# Patient Record
Sex: Male | Born: 1940
Health system: Southern US, Community
[De-identification: ages and names within clinical notes are randomized; demographics above are authoritative.]

## PROBLEM LIST (undated history)

## (undated) DIAGNOSIS — R972 Elevated prostate specific antigen [PSA]: Secondary | ICD-10-CM

## (undated) DIAGNOSIS — I10 Essential (primary) hypertension: Secondary | ICD-10-CM

## (undated) DIAGNOSIS — I2510 Atherosclerotic heart disease of native coronary artery without angina pectoris: Secondary | ICD-10-CM

## (undated) DIAGNOSIS — D649 Anemia, unspecified: Secondary | ICD-10-CM

## (undated) DIAGNOSIS — R0602 Shortness of breath: Secondary | ICD-10-CM

## (undated) DIAGNOSIS — R001 Bradycardia, unspecified: Secondary | ICD-10-CM

## (undated) DIAGNOSIS — F0390 Unspecified dementia without behavioral disturbance: Secondary | ICD-10-CM

## (undated) HISTORY — PX: CARDIAC SURGERY: SHX584

---

## 2008-09-22 ENCOUNTER — Ambulatory Visit: Payer: Self-pay | Admitting: Cardiology

## 2008-10-20 ENCOUNTER — Ambulatory Visit: Payer: Self-pay | Admitting: Unknown Physician Specialty

## 2013-07-24 ENCOUNTER — Ambulatory Visit: Payer: Self-pay | Admitting: Gastroenterology

## 2013-08-10 ENCOUNTER — Ambulatory Visit: Payer: Self-pay | Admitting: Gastroenterology

## 2013-08-13 LAB — PATHOLOGY REPORT

## 2013-12-31 DIAGNOSIS — E785 Hyperlipidemia, unspecified: Secondary | ICD-10-CM | POA: Insufficient documentation

## 2013-12-31 DIAGNOSIS — F0390 Unspecified dementia without behavioral disturbance: Secondary | ICD-10-CM | POA: Insufficient documentation

## 2013-12-31 DIAGNOSIS — I10 Essential (primary) hypertension: Secondary | ICD-10-CM | POA: Insufficient documentation

## 2013-12-31 DIAGNOSIS — I2510 Atherosclerotic heart disease of native coronary artery without angina pectoris: Secondary | ICD-10-CM | POA: Insufficient documentation

## 2014-04-13 DIAGNOSIS — I25810 Atherosclerosis of coronary artery bypass graft(s) without angina pectoris: Secondary | ICD-10-CM | POA: Insufficient documentation

## 2014-04-13 DIAGNOSIS — I495 Sick sinus syndrome: Secondary | ICD-10-CM | POA: Insufficient documentation

## 2014-04-13 DIAGNOSIS — D649 Anemia, unspecified: Secondary | ICD-10-CM | POA: Insufficient documentation

## 2014-04-13 DIAGNOSIS — M542 Cervicalgia: Secondary | ICD-10-CM | POA: Insufficient documentation

## 2014-04-13 DIAGNOSIS — I10 Essential (primary) hypertension: Secondary | ICD-10-CM | POA: Insufficient documentation

## 2014-12-28 DIAGNOSIS — F0150 Vascular dementia without behavioral disturbance: Secondary | ICD-10-CM | POA: Diagnosis not present

## 2014-12-28 DIAGNOSIS — I10 Essential (primary) hypertension: Secondary | ICD-10-CM | POA: Diagnosis not present

## 2014-12-28 DIAGNOSIS — E780 Pure hypercholesterolemia: Secondary | ICD-10-CM | POA: Diagnosis not present

## 2014-12-28 DIAGNOSIS — I2510 Atherosclerotic heart disease of native coronary artery without angina pectoris: Secondary | ICD-10-CM | POA: Diagnosis not present

## 2015-01-04 DIAGNOSIS — I10 Essential (primary) hypertension: Secondary | ICD-10-CM | POA: Diagnosis not present

## 2015-01-04 DIAGNOSIS — I2510 Atherosclerotic heart disease of native coronary artery without angina pectoris: Secondary | ICD-10-CM | POA: Diagnosis not present

## 2015-01-04 DIAGNOSIS — Z0000 Encounter for general adult medical examination without abnormal findings: Secondary | ICD-10-CM | POA: Diagnosis not present

## 2015-01-04 DIAGNOSIS — D649 Anemia, unspecified: Secondary | ICD-10-CM | POA: Diagnosis not present

## 2015-04-19 DIAGNOSIS — E780 Pure hypercholesterolemia: Secondary | ICD-10-CM | POA: Diagnosis not present

## 2015-04-19 DIAGNOSIS — I10 Essential (primary) hypertension: Secondary | ICD-10-CM | POA: Diagnosis not present

## 2015-04-19 DIAGNOSIS — I2510 Atherosclerotic heart disease of native coronary artery without angina pectoris: Secondary | ICD-10-CM | POA: Diagnosis not present

## 2015-04-19 DIAGNOSIS — I495 Sick sinus syndrome: Secondary | ICD-10-CM | POA: Diagnosis not present

## 2015-07-20 DIAGNOSIS — Z0000 Encounter for general adult medical examination without abnormal findings: Secondary | ICD-10-CM | POA: Diagnosis not present

## 2015-07-20 DIAGNOSIS — E7800 Pure hypercholesterolemia, unspecified: Secondary | ICD-10-CM | POA: Diagnosis not present

## 2015-07-20 DIAGNOSIS — I10 Essential (primary) hypertension: Secondary | ICD-10-CM | POA: Diagnosis not present

## 2015-07-20 DIAGNOSIS — D649 Anemia, unspecified: Secondary | ICD-10-CM | POA: Diagnosis not present

## 2015-07-20 DIAGNOSIS — Z125 Encounter for screening for malignant neoplasm of prostate: Secondary | ICD-10-CM | POA: Diagnosis not present

## 2015-07-20 DIAGNOSIS — I2510 Atherosclerotic heart disease of native coronary artery without angina pectoris: Secondary | ICD-10-CM | POA: Diagnosis not present

## 2015-07-27 DIAGNOSIS — D649 Anemia, unspecified: Secondary | ICD-10-CM | POA: Diagnosis not present

## 2015-07-27 DIAGNOSIS — I10 Essential (primary) hypertension: Secondary | ICD-10-CM | POA: Diagnosis not present

## 2015-07-27 DIAGNOSIS — M1712 Unilateral primary osteoarthritis, left knee: Secondary | ICD-10-CM | POA: Diagnosis not present

## 2015-07-27 DIAGNOSIS — F0150 Vascular dementia without behavioral disturbance: Secondary | ICD-10-CM | POA: Diagnosis not present

## 2016-01-23 DIAGNOSIS — D649 Anemia, unspecified: Secondary | ICD-10-CM | POA: Diagnosis not present

## 2016-01-23 DIAGNOSIS — F0150 Vascular dementia without behavioral disturbance: Secondary | ICD-10-CM | POA: Diagnosis not present

## 2016-01-23 DIAGNOSIS — I10 Essential (primary) hypertension: Secondary | ICD-10-CM | POA: Diagnosis not present

## 2016-01-30 DIAGNOSIS — M1712 Unilateral primary osteoarthritis, left knee: Secondary | ICD-10-CM | POA: Insufficient documentation

## 2016-01-30 DIAGNOSIS — I2510 Atherosclerotic heart disease of native coronary artery without angina pectoris: Secondary | ICD-10-CM | POA: Diagnosis not present

## 2016-01-30 DIAGNOSIS — I10 Essential (primary) hypertension: Secondary | ICD-10-CM | POA: Diagnosis not present

## 2016-01-30 DIAGNOSIS — E7800 Pure hypercholesterolemia, unspecified: Secondary | ICD-10-CM | POA: Diagnosis not present

## 2016-01-30 DIAGNOSIS — Z0000 Encounter for general adult medical examination without abnormal findings: Secondary | ICD-10-CM | POA: Diagnosis not present

## 2016-01-30 DIAGNOSIS — Z125 Encounter for screening for malignant neoplasm of prostate: Secondary | ICD-10-CM | POA: Diagnosis not present

## 2016-01-30 DIAGNOSIS — F0150 Vascular dementia without behavioral disturbance: Secondary | ICD-10-CM | POA: Diagnosis not present

## 2016-04-17 ENCOUNTER — Emergency Department
Admission: EM | Admit: 2016-04-17 | Discharge: 2016-04-17 | Payer: Commercial Managed Care - HMO | Attending: Emergency Medicine | Admitting: Emergency Medicine

## 2016-04-17 ENCOUNTER — Encounter: Payer: Self-pay | Admitting: Emergency Medicine

## 2016-04-17 DIAGNOSIS — Z7982 Long term (current) use of aspirin: Secondary | ICD-10-CM | POA: Insufficient documentation

## 2016-04-17 DIAGNOSIS — Z79899 Other long term (current) drug therapy: Secondary | ICD-10-CM | POA: Insufficient documentation

## 2016-04-17 DIAGNOSIS — K222 Esophageal obstruction: Secondary | ICD-10-CM | POA: Diagnosis not present

## 2016-04-17 DIAGNOSIS — R112 Nausea with vomiting, unspecified: Secondary | ICD-10-CM | POA: Diagnosis not present

## 2016-04-17 DIAGNOSIS — Z87891 Personal history of nicotine dependence: Secondary | ICD-10-CM | POA: Diagnosis not present

## 2016-04-17 LAB — CBC
HCT: 40.8 % (ref 40.0–52.0)
HEMOGLOBIN: 13.8 g/dL (ref 13.0–18.0)
MCH: 32.4 pg (ref 26.0–34.0)
MCHC: 33.9 g/dL (ref 32.0–36.0)
MCV: 95.6 fL (ref 80.0–100.0)
PLATELETS: 210 10*3/uL (ref 150–440)
RBC: 4.27 MIL/uL — AB (ref 4.40–5.90)
RDW: 13.4 % (ref 11.5–14.5)
WBC: 6.5 10*3/uL (ref 3.8–10.6)

## 2016-04-17 LAB — COMPREHENSIVE METABOLIC PANEL
ALK PHOS: 44 U/L (ref 38–126)
ALT: 17 U/L (ref 17–63)
ANION GAP: 8 (ref 5–15)
AST: 30 U/L (ref 15–41)
Albumin: 4.2 g/dL (ref 3.5–5.0)
BILIRUBIN TOTAL: 0.8 mg/dL (ref 0.3–1.2)
BUN: 9 mg/dL (ref 6–20)
CALCIUM: 9.6 mg/dL (ref 8.9–10.3)
CO2: 26 mmol/L (ref 22–32)
CREATININE: 1.07 mg/dL (ref 0.61–1.24)
Chloride: 101 mmol/L (ref 101–111)
Glucose, Bld: 103 mg/dL — ABNORMAL HIGH (ref 65–99)
Potassium: 4.2 mmol/L (ref 3.5–5.1)
Sodium: 135 mmol/L (ref 135–145)
TOTAL PROTEIN: 7.2 g/dL (ref 6.5–8.1)

## 2016-04-17 LAB — DIFFERENTIAL
BASOS ABS: 0.1 10*3/uL (ref 0–0.1)
Basophils Relative: 1 %
EOS ABS: 0.4 10*3/uL (ref 0–0.7)
EOS PCT: 6 %
LYMPHS ABS: 1.6 10*3/uL (ref 1.0–3.6)
LYMPHS PCT: 25 %
Monocytes Absolute: 0.6 10*3/uL (ref 0.2–1.0)
Monocytes Relative: 9 %
NEUTROS PCT: 59 %
Neutro Abs: 3.8 10*3/uL (ref 1.4–6.5)

## 2016-04-17 MED ORDER — SODIUM CHLORIDE 0.9 % IV SOLN
Freq: Once | INTRAVENOUS | Status: AC
  Administered 2016-04-17: 18:00:00 via INTRAVENOUS

## 2016-04-17 NOTE — ED Notes (Signed)
MD at bedside. 

## 2016-04-17 NOTE — ED Notes (Signed)
Attempted to call report to [facility] ED. Unable to give report at this time. Will call back.

## 2016-04-17 NOTE — ED Triage Notes (Signed)
C/O having intermittent difficulty swallowing over the past few weeks.  Patient has had esophogeal stretching in the past and states he feels like he needs esophageal stretched again.  Today patient took some medication at 1000 and felt that pill was "hung up".  Initially had some discomfort, then had an episode of hiccups, then patient vomited once and symptoms have resolved.

## 2016-04-17 NOTE — ED Notes (Signed)
Pts wife returned to ED reporting she lost her car keys. Keys found in bathroom and wife called and informed that keys will be at front desk. Phone number if needed to contact her last is [phone].

## 2016-04-17 NOTE — ED Provider Notes (Signed)
Discussed patient with Dr. Robet Leu at the [facility]. She except's transport. She confirmed with GI. Patient wanted to go down by private vehicle but the [place] does not want to do this therefore we will have him go with an IV in the ambulance.   Nena Polio, MD 04/17/16 [phone]

## 2016-04-17 NOTE — ED Provider Notes (Signed)
[facility] Emergency Department Provider Note   ____________________________________________   First MD Initiated Contact with Patient 04/17/16 1319     (approximate)  I have reviewed the triage vital signs and the nursing notes.   HISTORY  Chief Complaint No chief complaint on file.    HPI Marcus Shepherd is a 75 y.o. male with a history of esophageal stricture and dementia who is presenting to the emergency department today with an inability to swallow. He says that he was taking his heartburn medicine this morning when he thought that he felt the pill got stuck in his throat. Since then he has vomited twice. He has felt better after vomiting but is still not tolerating by mouth fluids. He denies any pain at this time. Said that 5 years ago he had an esophageal dilatation secondary to stricture. He is a [place] patient.   History reviewed. No pertinent past medical history.  There are no active problems to display for this patient.   Past Surgical History:  Procedure Laterality Date  . CARDIAC SURGERY     cardiac stent 2001    Prior to Admission medications   Not on File    Allergies Review of patient's allergies indicates no known allergies.  No family history on file.  Social History Social History  Substance Use Topics  . Smoking status: Former Smoker    Types: Cigarettes  . Smokeless tobacco: Never Used  . Alcohol use No    Review of Systems Constitutional: No fever/chills Eyes: No visual changes. ENT: No sore throat. Cardiovascular: Denies chest pain. Respiratory: Denies shortness of breath. Gastrointestinal: No abdominal pain. No diarrhea.  No constipation. Genitourinary: Negative for dysuria. Musculoskeletal: Negative for back pain. Skin: Negative for rash. Neurological: Negative for headaches, focal weakness or numbness.  10-point ROS otherwise negative.  ____________________________________________   PHYSICAL  EXAM:  VITAL SIGNS: ED Triage Vitals  Enc Vitals Group     BP 04/17/16 1222 (!) 145/81     Pulse Rate 04/17/16 1222 74     Resp 04/17/16 1222 18     Temp 04/17/16 1222 98.5 F (36.9 C)     Temp Source 04/17/16 1222 Oral     SpO2 04/17/16 1222 98 %     Weight 04/17/16 1222 175 lb (79.4 kg)     Height 04/17/16 1222 5\' 10"  (1.778 m)     Head Circumference --      Peak Flow --      Pain Score 04/17/16 1235 0     Pain Loc --      Pain Edu? --      Excl. in [place]? --     Constitutional: Alert and oriented. Well appearing and in no acute distress. Eyes: Conjunctivae are normal. PERRL. EOMI. Head: Atraumatic. Nose: No congestion/rhinnorhea. Mouth/Throat: Mucous membranes are moist.   Neck: No stridor.   Cardiovascular: Normal rate, regular rhythm. Grossly normal heart sounds.  Respiratory: Normal respiratory effort.  No retractions. Lungs CTAB. Gastrointestinal: Soft and nontender. No distention.  Musculoskeletal: No lower extremity tenderness nor edema.  No joint effusions. Neurologic:  Normal speech and language. No gross focal neurologic deficits are appreciated.  Skin:  Skin is warm, dry and intact. No rash noted. Psychiatric: Mood and affect are normal. Speech and behavior are normal.  Patient attempted to drink Diet Coke during the examination but vomited forcefully. ____________________________________________   LABS (all labs ordered are listed, but only abnormal results are displayed)  Labs Reviewed  CBC WITH DIFFERENTIAL/PLATELET  BASIC METABOLIC PANEL   ____________________________________________  EKG   ____________________________________________  RADIOLOGY   ____________________________________________   PROCEDURES  Procedure(s) performed:   Procedures  Critical Care performed: No  ____________________________________________   INITIAL IMPRESSION / ASSESSMENT AND PLAN / ED COURSE  Pertinent labs & imaging results that were available during my  care of the patient were reviewed by me and considered in my medical decision making (see chart for details).  ----------------------------------------- 320 PM on 04/17/2016 ----------------------------------------- Patient likely with esophageal stricture causing obstruction. There is no gastroenterologist on call today at [facility]. The patient will need to be transferred to the [place] completed [facility] Signed out to Dr. Rip Harbour.  Possible impacted foreign body with the pill.  I would suspect the pill would've at least partially dissolved by this time allowing passage of some substances down to the stomach. However, I suppose it is possible that the esophagus has been to the point where the pill is impacted. Because of the patient's compensated history with the need for esophageal dilatation I believe he would benefit most from being scoped at this time.  I explained this plan and the patient also his husband and they are understanding of this plan and willing to comply.   Clinical Course     ____________________________________________   FINAL CLINICAL IMPRESSION(S) / ED DIAGNOSES  Esophageal stricture. Vomiting.    NEW MEDICATIONS STARTED DURING THIS VISIT:  New Prescriptions   No medications on file     Note:  This document was prepared using Dragon voice recognition software and may include unintentional dictation errors.    Orbie Pyo, MD 04/17/16 [phone]

## 2016-04-17 NOTE — ED Notes (Signed)
Spoke to [place] MD is reviewing  packet

## 2016-04-17 NOTE — ED Notes (Signed)
Called for transport [phone]

## 2016-05-03 DIAGNOSIS — I10 Essential (primary) hypertension: Secondary | ICD-10-CM | POA: Diagnosis not present

## 2016-05-03 DIAGNOSIS — E7800 Pure hypercholesterolemia, unspecified: Secondary | ICD-10-CM | POA: Diagnosis not present

## 2016-05-03 DIAGNOSIS — I495 Sick sinus syndrome: Secondary | ICD-10-CM | POA: Diagnosis not present

## 2016-05-03 DIAGNOSIS — I2510 Atherosclerotic heart disease of native coronary artery without angina pectoris: Secondary | ICD-10-CM | POA: Diagnosis not present

## 2016-05-28 DIAGNOSIS — I2510 Atherosclerotic heart disease of native coronary artery without angina pectoris: Secondary | ICD-10-CM | POA: Diagnosis not present

## 2016-06-01 DIAGNOSIS — I495 Sick sinus syndrome: Secondary | ICD-10-CM | POA: Diagnosis not present

## 2016-06-01 DIAGNOSIS — E7800 Pure hypercholesterolemia, unspecified: Secondary | ICD-10-CM | POA: Diagnosis not present

## 2016-06-01 DIAGNOSIS — I10 Essential (primary) hypertension: Secondary | ICD-10-CM | POA: Diagnosis not present

## 2016-06-01 DIAGNOSIS — I2510 Atherosclerotic heart disease of native coronary artery without angina pectoris: Secondary | ICD-10-CM | POA: Diagnosis not present

## 2016-07-17 DIAGNOSIS — Z0000 Encounter for general adult medical examination without abnormal findings: Secondary | ICD-10-CM | POA: Diagnosis not present

## 2016-07-17 DIAGNOSIS — I2510 Atherosclerotic heart disease of native coronary artery without angina pectoris: Secondary | ICD-10-CM | POA: Diagnosis not present

## 2016-07-17 DIAGNOSIS — Z125 Encounter for screening for malignant neoplasm of prostate: Secondary | ICD-10-CM | POA: Diagnosis not present

## 2016-07-17 DIAGNOSIS — I10 Essential (primary) hypertension: Secondary | ICD-10-CM | POA: Diagnosis not present

## 2016-07-17 DIAGNOSIS — F0150 Vascular dementia without behavioral disturbance: Secondary | ICD-10-CM | POA: Diagnosis not present

## 2016-07-17 DIAGNOSIS — M1712 Unilateral primary osteoarthritis, left knee: Secondary | ICD-10-CM | POA: Diagnosis not present

## 2016-07-17 DIAGNOSIS — E7800 Pure hypercholesterolemia, unspecified: Secondary | ICD-10-CM | POA: Diagnosis not present

## 2016-07-24 DIAGNOSIS — I2510 Atherosclerotic heart disease of native coronary artery without angina pectoris: Secondary | ICD-10-CM | POA: Diagnosis not present

## 2016-07-24 DIAGNOSIS — D649 Anemia, unspecified: Secondary | ICD-10-CM | POA: Diagnosis not present

## 2016-07-24 DIAGNOSIS — I10 Essential (primary) hypertension: Secondary | ICD-10-CM | POA: Diagnosis not present

## 2016-07-24 DIAGNOSIS — Z0000 Encounter for general adult medical examination without abnormal findings: Secondary | ICD-10-CM | POA: Diagnosis not present

## 2016-07-24 DIAGNOSIS — M542 Cervicalgia: Secondary | ICD-10-CM | POA: Diagnosis not present

## 2016-11-29 DIAGNOSIS — I495 Sick sinus syndrome: Secondary | ICD-10-CM | POA: Diagnosis not present

## 2016-11-29 DIAGNOSIS — I2510 Atherosclerotic heart disease of native coronary artery without angina pectoris: Secondary | ICD-10-CM | POA: Diagnosis not present

## 2016-11-29 DIAGNOSIS — E7800 Pure hypercholesterolemia, unspecified: Secondary | ICD-10-CM | POA: Diagnosis not present

## 2016-11-29 DIAGNOSIS — I10 Essential (primary) hypertension: Secondary | ICD-10-CM | POA: Diagnosis not present

## 2017-01-23 DIAGNOSIS — D508 Other iron deficiency anemias: Secondary | ICD-10-CM | POA: Diagnosis not present

## 2017-01-23 DIAGNOSIS — M542 Cervicalgia: Secondary | ICD-10-CM | POA: Diagnosis not present

## 2017-01-23 DIAGNOSIS — Z0000 Encounter for general adult medical examination without abnormal findings: Secondary | ICD-10-CM | POA: Diagnosis not present

## 2017-01-23 DIAGNOSIS — I10 Essential (primary) hypertension: Secondary | ICD-10-CM | POA: Diagnosis not present

## 2017-01-23 DIAGNOSIS — R8290 Unspecified abnormal findings in urine: Secondary | ICD-10-CM | POA: Diagnosis not present

## 2017-01-23 DIAGNOSIS — I2510 Atherosclerotic heart disease of native coronary artery without angina pectoris: Secondary | ICD-10-CM | POA: Diagnosis not present

## 2017-01-23 DIAGNOSIS — D649 Anemia, unspecified: Secondary | ICD-10-CM | POA: Diagnosis not present

## 2017-01-30 DIAGNOSIS — I2510 Atherosclerotic heart disease of native coronary artery without angina pectoris: Secondary | ICD-10-CM | POA: Diagnosis not present

## 2017-01-30 DIAGNOSIS — Z0000 Encounter for general adult medical examination without abnormal findings: Secondary | ICD-10-CM | POA: Diagnosis not present

## 2017-01-30 DIAGNOSIS — I10 Essential (primary) hypertension: Secondary | ICD-10-CM | POA: Diagnosis not present

## 2017-01-30 DIAGNOSIS — F0390 Unspecified dementia without behavioral disturbance: Secondary | ICD-10-CM | POA: Diagnosis not present

## 2017-06-03 DIAGNOSIS — I10 Essential (primary) hypertension: Secondary | ICD-10-CM | POA: Diagnosis not present

## 2017-06-03 DIAGNOSIS — I495 Sick sinus syndrome: Secondary | ICD-10-CM | POA: Diagnosis not present

## 2017-06-03 DIAGNOSIS — E7800 Pure hypercholesterolemia, unspecified: Secondary | ICD-10-CM | POA: Diagnosis not present

## 2017-07-25 DIAGNOSIS — Z0000 Encounter for general adult medical examination without abnormal findings: Secondary | ICD-10-CM | POA: Diagnosis not present

## 2017-07-25 DIAGNOSIS — I10 Essential (primary) hypertension: Secondary | ICD-10-CM | POA: Diagnosis not present

## 2017-07-25 DIAGNOSIS — I2510 Atherosclerotic heart disease of native coronary artery without angina pectoris: Secondary | ICD-10-CM | POA: Diagnosis not present

## 2017-07-25 DIAGNOSIS — F0390 Unspecified dementia without behavioral disturbance: Secondary | ICD-10-CM | POA: Diagnosis not present

## 2017-07-25 DIAGNOSIS — Z125 Encounter for screening for malignant neoplasm of prostate: Secondary | ICD-10-CM | POA: Diagnosis not present

## 2017-08-01 DIAGNOSIS — F0150 Vascular dementia without behavioral disturbance: Secondary | ICD-10-CM | POA: Diagnosis not present

## 2017-08-01 DIAGNOSIS — I10 Essential (primary) hypertension: Secondary | ICD-10-CM | POA: Diagnosis not present

## 2017-08-01 DIAGNOSIS — I2510 Atherosclerotic heart disease of native coronary artery without angina pectoris: Secondary | ICD-10-CM | POA: Diagnosis not present

## 2017-08-01 DIAGNOSIS — D649 Anemia, unspecified: Secondary | ICD-10-CM | POA: Diagnosis not present

## 2017-08-01 DIAGNOSIS — E7800 Pure hypercholesterolemia, unspecified: Secondary | ICD-10-CM | POA: Diagnosis not present

## 2017-12-03 DIAGNOSIS — I10 Essential (primary) hypertension: Secondary | ICD-10-CM | POA: Diagnosis not present

## 2017-12-03 DIAGNOSIS — I495 Sick sinus syndrome: Secondary | ICD-10-CM | POA: Diagnosis not present

## 2017-12-03 DIAGNOSIS — I2510 Atherosclerotic heart disease of native coronary artery without angina pectoris: Secondary | ICD-10-CM | POA: Diagnosis not present

## 2017-12-03 DIAGNOSIS — E7800 Pure hypercholesterolemia, unspecified: Secondary | ICD-10-CM | POA: Diagnosis not present

## 2017-12-05 DIAGNOSIS — M722 Plantar fascial fibromatosis: Secondary | ICD-10-CM | POA: Diagnosis not present

## 2017-12-11 DIAGNOSIS — Z85828 Personal history of other malignant neoplasm of skin: Secondary | ICD-10-CM | POA: Insufficient documentation

## 2018-01-21 DIAGNOSIS — F0150 Vascular dementia without behavioral disturbance: Secondary | ICD-10-CM | POA: Diagnosis not present

## 2018-01-21 DIAGNOSIS — R8290 Unspecified abnormal findings in urine: Secondary | ICD-10-CM | POA: Diagnosis not present

## 2018-01-21 DIAGNOSIS — E7800 Pure hypercholesterolemia, unspecified: Secondary | ICD-10-CM | POA: Diagnosis not present

## 2018-01-21 DIAGNOSIS — I2510 Atherosclerotic heart disease of native coronary artery without angina pectoris: Secondary | ICD-10-CM | POA: Diagnosis not present

## 2018-01-21 DIAGNOSIS — D649 Anemia, unspecified: Secondary | ICD-10-CM | POA: Diagnosis not present

## 2018-01-21 DIAGNOSIS — I10 Essential (primary) hypertension: Secondary | ICD-10-CM | POA: Diagnosis not present

## 2018-01-31 DIAGNOSIS — I10 Essential (primary) hypertension: Secondary | ICD-10-CM | POA: Diagnosis not present

## 2018-01-31 DIAGNOSIS — I2510 Atherosclerotic heart disease of native coronary artery without angina pectoris: Secondary | ICD-10-CM | POA: Diagnosis not present

## 2018-01-31 DIAGNOSIS — E7800 Pure hypercholesterolemia, unspecified: Secondary | ICD-10-CM | POA: Diagnosis not present

## 2018-01-31 DIAGNOSIS — Z0000 Encounter for general adult medical examination without abnormal findings: Secondary | ICD-10-CM | POA: Diagnosis not present

## 2018-01-31 DIAGNOSIS — D649 Anemia, unspecified: Secondary | ICD-10-CM | POA: Diagnosis not present

## 2018-01-31 DIAGNOSIS — F0150 Vascular dementia without behavioral disturbance: Secondary | ICD-10-CM | POA: Diagnosis not present

## 2018-05-01 DIAGNOSIS — C44319 Basal cell carcinoma of skin of other parts of face: Secondary | ICD-10-CM | POA: Diagnosis not present

## 2018-06-04 DIAGNOSIS — E7800 Pure hypercholesterolemia, unspecified: Secondary | ICD-10-CM | POA: Diagnosis not present

## 2018-06-04 DIAGNOSIS — R001 Bradycardia, unspecified: Secondary | ICD-10-CM | POA: Insufficient documentation

## 2018-06-04 DIAGNOSIS — I25810 Atherosclerosis of coronary artery bypass graft(s) without angina pectoris: Secondary | ICD-10-CM | POA: Diagnosis not present

## 2018-06-04 DIAGNOSIS — I495 Sick sinus syndrome: Secondary | ICD-10-CM | POA: Diagnosis not present

## 2018-07-21 DIAGNOSIS — E7800 Pure hypercholesterolemia, unspecified: Secondary | ICD-10-CM | POA: Diagnosis not present

## 2018-07-21 DIAGNOSIS — D649 Anemia, unspecified: Secondary | ICD-10-CM | POA: Diagnosis not present

## 2018-07-21 DIAGNOSIS — F0150 Vascular dementia without behavioral disturbance: Secondary | ICD-10-CM | POA: Diagnosis not present

## 2018-07-21 DIAGNOSIS — I2510 Atherosclerotic heart disease of native coronary artery without angina pectoris: Secondary | ICD-10-CM | POA: Diagnosis not present

## 2018-07-21 DIAGNOSIS — I10 Essential (primary) hypertension: Secondary | ICD-10-CM | POA: Diagnosis not present

## 2018-07-21 DIAGNOSIS — Z0000 Encounter for general adult medical examination without abnormal findings: Secondary | ICD-10-CM | POA: Diagnosis not present

## 2018-07-28 DIAGNOSIS — Z0000 Encounter for general adult medical examination without abnormal findings: Secondary | ICD-10-CM | POA: Diagnosis not present

## 2018-07-28 DIAGNOSIS — F0150 Vascular dementia without behavioral disturbance: Secondary | ICD-10-CM | POA: Diagnosis not present

## 2018-07-28 DIAGNOSIS — D649 Anemia, unspecified: Secondary | ICD-10-CM | POA: Diagnosis not present

## 2018-07-28 DIAGNOSIS — H9313 Tinnitus, bilateral: Secondary | ICD-10-CM | POA: Diagnosis not present

## 2018-07-28 DIAGNOSIS — I2510 Atherosclerotic heart disease of native coronary artery without angina pectoris: Secondary | ICD-10-CM | POA: Diagnosis not present

## 2018-07-28 DIAGNOSIS — I10 Essential (primary) hypertension: Secondary | ICD-10-CM | POA: Diagnosis not present

## 2018-10-06 DIAGNOSIS — M1611 Unilateral primary osteoarthritis, right hip: Secondary | ICD-10-CM | POA: Diagnosis not present

## 2018-10-06 DIAGNOSIS — M48061 Spinal stenosis, lumbar region without neurogenic claudication: Secondary | ICD-10-CM | POA: Diagnosis not present

## 2018-10-06 DIAGNOSIS — M5441 Lumbago with sciatica, right side: Secondary | ICD-10-CM | POA: Diagnosis not present

## 2018-10-06 DIAGNOSIS — F0150 Vascular dementia without behavioral disturbance: Secondary | ICD-10-CM | POA: Diagnosis not present

## 2018-10-06 DIAGNOSIS — M25551 Pain in right hip: Secondary | ICD-10-CM | POA: Diagnosis not present

## 2018-10-06 DIAGNOSIS — M4186 Other forms of scoliosis, lumbar region: Secondary | ICD-10-CM | POA: Diagnosis not present

## 2018-10-06 DIAGNOSIS — M47816 Spondylosis without myelopathy or radiculopathy, lumbar region: Secondary | ICD-10-CM | POA: Diagnosis not present

## 2018-12-02 DIAGNOSIS — I2510 Atherosclerotic heart disease of native coronary artery without angina pectoris: Secondary | ICD-10-CM | POA: Diagnosis not present

## 2018-12-02 DIAGNOSIS — F0150 Vascular dementia without behavioral disturbance: Secondary | ICD-10-CM | POA: Diagnosis not present

## 2018-12-02 DIAGNOSIS — E7800 Pure hypercholesterolemia, unspecified: Secondary | ICD-10-CM | POA: Diagnosis not present

## 2018-12-02 DIAGNOSIS — R001 Bradycardia, unspecified: Secondary | ICD-10-CM | POA: Diagnosis not present

## 2018-12-02 DIAGNOSIS — I495 Sick sinus syndrome: Secondary | ICD-10-CM | POA: Diagnosis not present

## 2018-12-02 DIAGNOSIS — I10 Essential (primary) hypertension: Secondary | ICD-10-CM | POA: Diagnosis not present

## 2018-12-02 DIAGNOSIS — I25810 Atherosclerosis of coronary artery bypass graft(s) without angina pectoris: Secondary | ICD-10-CM | POA: Diagnosis not present

## 2019-01-26 DIAGNOSIS — H9313 Tinnitus, bilateral: Secondary | ICD-10-CM | POA: Diagnosis not present

## 2019-01-26 DIAGNOSIS — Z125 Encounter for screening for malignant neoplasm of prostate: Secondary | ICD-10-CM | POA: Diagnosis not present

## 2019-01-26 DIAGNOSIS — F0150 Vascular dementia without behavioral disturbance: Secondary | ICD-10-CM | POA: Diagnosis not present

## 2019-01-26 DIAGNOSIS — D649 Anemia, unspecified: Secondary | ICD-10-CM | POA: Diagnosis not present

## 2019-01-26 DIAGNOSIS — I10 Essential (primary) hypertension: Secondary | ICD-10-CM | POA: Diagnosis not present

## 2019-01-26 DIAGNOSIS — I2510 Atherosclerotic heart disease of native coronary artery without angina pectoris: Secondary | ICD-10-CM | POA: Diagnosis not present

## 2019-02-02 DIAGNOSIS — R972 Elevated prostate specific antigen [PSA]: Secondary | ICD-10-CM | POA: Diagnosis not present

## 2019-02-02 DIAGNOSIS — Z0000 Encounter for general adult medical examination without abnormal findings: Secondary | ICD-10-CM | POA: Diagnosis not present

## 2019-02-02 DIAGNOSIS — I10 Essential (primary) hypertension: Secondary | ICD-10-CM | POA: Diagnosis not present

## 2019-02-02 DIAGNOSIS — D649 Anemia, unspecified: Secondary | ICD-10-CM | POA: Diagnosis not present

## 2019-02-02 DIAGNOSIS — M1712 Unilateral primary osteoarthritis, left knee: Secondary | ICD-10-CM | POA: Diagnosis not present

## 2019-02-02 DIAGNOSIS — F0150 Vascular dementia without behavioral disturbance: Secondary | ICD-10-CM | POA: Diagnosis not present

## 2019-02-02 DIAGNOSIS — I25810 Atherosclerosis of coronary artery bypass graft(s) without angina pectoris: Secondary | ICD-10-CM | POA: Diagnosis not present

## 2019-06-02 DIAGNOSIS — R001 Bradycardia, unspecified: Secondary | ICD-10-CM | POA: Diagnosis not present

## 2019-06-02 DIAGNOSIS — E7800 Pure hypercholesterolemia, unspecified: Secondary | ICD-10-CM | POA: Diagnosis not present

## 2019-06-02 DIAGNOSIS — I10 Essential (primary) hypertension: Secondary | ICD-10-CM | POA: Diagnosis not present

## 2019-06-02 DIAGNOSIS — I2510 Atherosclerotic heart disease of native coronary artery without angina pectoris: Secondary | ICD-10-CM | POA: Diagnosis not present

## 2019-06-02 DIAGNOSIS — F0150 Vascular dementia without behavioral disturbance: Secondary | ICD-10-CM | POA: Diagnosis not present

## 2019-07-28 DIAGNOSIS — M1712 Unilateral primary osteoarthritis, left knee: Secondary | ICD-10-CM | POA: Diagnosis not present

## 2019-07-28 DIAGNOSIS — Z79899 Other long term (current) drug therapy: Secondary | ICD-10-CM | POA: Diagnosis not present

## 2019-07-28 DIAGNOSIS — I10 Essential (primary) hypertension: Secondary | ICD-10-CM | POA: Diagnosis not present

## 2019-07-28 DIAGNOSIS — D649 Anemia, unspecified: Secondary | ICD-10-CM | POA: Diagnosis not present

## 2019-07-28 DIAGNOSIS — I25810 Atherosclerosis of coronary artery bypass graft(s) without angina pectoris: Secondary | ICD-10-CM | POA: Diagnosis not present

## 2019-07-28 DIAGNOSIS — Z0000 Encounter for general adult medical examination without abnormal findings: Secondary | ICD-10-CM | POA: Diagnosis not present

## 2019-07-28 DIAGNOSIS — R8290 Unspecified abnormal findings in urine: Secondary | ICD-10-CM | POA: Diagnosis not present

## 2019-07-28 DIAGNOSIS — R972 Elevated prostate specific antigen [PSA]: Secondary | ICD-10-CM | POA: Diagnosis not present

## 2019-07-28 DIAGNOSIS — F0150 Vascular dementia without behavioral disturbance: Secondary | ICD-10-CM | POA: Diagnosis not present

## 2019-08-31 DIAGNOSIS — I10 Essential (primary) hypertension: Secondary | ICD-10-CM | POA: Diagnosis not present

## 2019-08-31 DIAGNOSIS — E7800 Pure hypercholesterolemia, unspecified: Secondary | ICD-10-CM | POA: Diagnosis not present

## 2019-08-31 DIAGNOSIS — I2510 Atherosclerotic heart disease of native coronary artery without angina pectoris: Secondary | ICD-10-CM | POA: Diagnosis not present

## 2019-08-31 DIAGNOSIS — I495 Sick sinus syndrome: Secondary | ICD-10-CM | POA: Diagnosis not present

## 2019-09-07 DIAGNOSIS — Z79899 Other long term (current) drug therapy: Secondary | ICD-10-CM | POA: Diagnosis not present

## 2019-09-07 DIAGNOSIS — E7800 Pure hypercholesterolemia, unspecified: Secondary | ICD-10-CM | POA: Diagnosis not present

## 2019-09-07 DIAGNOSIS — E785 Hyperlipidemia, unspecified: Secondary | ICD-10-CM | POA: Diagnosis not present

## 2019-09-07 DIAGNOSIS — F0390 Unspecified dementia without behavioral disturbance: Secondary | ICD-10-CM | POA: Diagnosis not present

## 2019-09-07 DIAGNOSIS — D649 Anemia, unspecified: Secondary | ICD-10-CM | POA: Diagnosis not present

## 2019-09-07 DIAGNOSIS — R972 Elevated prostate specific antigen [PSA]: Secondary | ICD-10-CM | POA: Insufficient documentation

## 2019-09-07 DIAGNOSIS — I10 Essential (primary) hypertension: Secondary | ICD-10-CM | POA: Diagnosis not present

## 2019-09-07 DIAGNOSIS — I2510 Atherosclerotic heart disease of native coronary artery without angina pectoris: Secondary | ICD-10-CM | POA: Diagnosis not present

## 2019-09-07 DIAGNOSIS — Z0000 Encounter for general adult medical examination without abnormal findings: Secondary | ICD-10-CM | POA: Diagnosis not present

## 2019-09-07 DIAGNOSIS — I495 Sick sinus syndrome: Secondary | ICD-10-CM | POA: Diagnosis not present

## 2019-10-09 ENCOUNTER — Ambulatory Visit: Payer: Medicare HMO | Admitting: Urology

## 2019-10-09 ENCOUNTER — Encounter: Payer: Self-pay | Admitting: Urology

## 2020-03-01 DIAGNOSIS — I2510 Atherosclerotic heart disease of native coronary artery without angina pectoris: Secondary | ICD-10-CM | POA: Diagnosis not present

## 2020-03-01 DIAGNOSIS — F0390 Unspecified dementia without behavioral disturbance: Secondary | ICD-10-CM | POA: Diagnosis not present

## 2020-03-01 DIAGNOSIS — I10 Essential (primary) hypertension: Secondary | ICD-10-CM | POA: Diagnosis not present

## 2020-03-01 DIAGNOSIS — R972 Elevated prostate specific antigen [PSA]: Secondary | ICD-10-CM | POA: Diagnosis not present

## 2020-03-01 DIAGNOSIS — D649 Anemia, unspecified: Secondary | ICD-10-CM | POA: Diagnosis not present

## 2020-03-01 DIAGNOSIS — R8290 Unspecified abnormal findings in urine: Secondary | ICD-10-CM | POA: Diagnosis not present

## 2020-03-08 DIAGNOSIS — Z79899 Other long term (current) drug therapy: Secondary | ICD-10-CM | POA: Diagnosis not present

## 2020-03-08 DIAGNOSIS — Z0000 Encounter for general adult medical examination without abnormal findings: Secondary | ICD-10-CM | POA: Diagnosis not present

## 2020-03-08 DIAGNOSIS — D649 Anemia, unspecified: Secondary | ICD-10-CM | POA: Diagnosis not present

## 2020-03-08 DIAGNOSIS — R972 Elevated prostate specific antigen [PSA]: Secondary | ICD-10-CM | POA: Diagnosis not present

## 2020-03-08 DIAGNOSIS — I10 Essential (primary) hypertension: Secondary | ICD-10-CM | POA: Diagnosis not present

## 2020-03-08 DIAGNOSIS — F0390 Unspecified dementia without behavioral disturbance: Secondary | ICD-10-CM | POA: Diagnosis not present

## 2020-04-18 NOTE — Progress Notes (Signed)
04/19/2020 2:47 PM   Marcus Shepherd [birth year] [id number]  Referring provider: Tracie Harrier, MD [address] [facility] [place],  [place] [address] Chief Complaint  Patient presents with  . Elevated PSA    HPI: Marcus Shepherd is a 79 y.o. male who presents today for evaluation and management of elevated PSA. He is accompanied by his wife.   Patient is a poor historian secondary to dementia. His wife is the primary historian.   He last saw his PCP on 09/07/19. He continued to have memory issues. No behavorial issues. He had no further complaints.  PSA was was elevated at 6.36 at that time.   His wife reports no urinary symptoms.   She notes he has lost 20 since 08/2019. He has a poor appetite but she is working to make sure he gains weight back. She is working to improve his diet.   PSA trend: 07/20/2015: 2.72 07/17/2016: 2.81 07/25/2017: 3.63 01/26/2019: 4.40 07/28/2019: 6.36 03/01/2020: 6.52   PMH: No past medical history on file.  Surgical History: Past Surgical History:  Procedure Laterality Date  . CARDIAC SURGERY     cardiac stent 2001    Home Medications:  Allergies as of 04/19/2020   No Known Allergies     Medication List       Accurate as of April 19, 2020  2:47 PM. If you have any questions, ask your nurse or doctor.        aspirin 325 MG EC tablet Take 325 mg by mouth daily.   cyanocobalamin 1000 MCG tablet Take by mouth.   donepezil 10 MG tablet Commonly known as: ARICEPT Take 10 mg by mouth at bedtime.   memantine 10 MG tablet Commonly known as: NAMENDA Take 10 mg by mouth 2 (two) times daily.   multivitamin with minerals Tabs tablet Take 1 tablet by mouth daily.   omeprazole 40 MG capsule Commonly known as: PRILOSEC Take 40 mg by mouth daily.   pantoprazole 40 MG tablet Commonly known as: PROTONIX Take 40 mg by mouth daily.   ramipril 5 MG capsule Commonly known as: ALTACE Take 5 mg by mouth  daily.   sertraline 25 MG tablet Commonly known as: ZOLOFT   simvastatin 80 MG tablet Commonly known as: ZOCOR Take 80 mg by mouth at bedtime.   traZODone 50 MG tablet Commonly known as: DESYREL Take by mouth.       Allergies: No Known Allergies  Family History: No family history on file.  Social History:  reports that he has quit smoking. His smoking use included cigarettes. He has never used smokeless tobacco. He reports that he does not drink alcohol and does not use drugs.   Physical Exam: BP 128/69   Pulse (!) 58   Wt 154 lb (69.9 kg)   BMI 22.10 kg/m   Constitutional:  Alert and oriented, No acute distress. Wife is main historian.  HEENT: [place] AT, unable to tolerate mask. Face shield. Trachea midline, no masses. Cardiovascular: No clubbing, cyanosis, or edema. Respiratory: Normal respiratory effort, no increased work of breathing. Skin: No rashes, bruises or suspicious lesions. Neurologic: Grossly intact, no focal deficits, moving all 4 extremities. Psychiatric: Somewhat agitated, plugging years, unable to tolerate mask t. Unable to answer questions appropriately on his own.   Assessment & Plan:    1. Elevated PSA Given his age, advanced medical issues and comorbidities will defer from PSA screenings. PSA, although rising is rising relatively slowly and has been  essentially stable over the past year which is reassuring Unable to tolerate rectal exam today PSA is doubling slowly and the likelihood of  cancer is low; Clinically significant prostate cancer risk is miniscule Patient is unable to tolerate in office biopsy secondary to dementia even if there was enough concern to warrant this After lengthy discussion with his wife, she would not want him to go the operating room for biopsy.  At this point time, given the above, I recommended no further screening, treatment or evaluation. Defer from PSA surveillance permanently.  Patient and wife agreed.    [place] [address] [place], [place] [address] [phone]  I, Selena Batten, am acting as a scribe for Dr. Hollice Espy.  I have reviewed the above documentation for accuracy and completeness, and I agree with the above.   Hollice Espy, MD

## 2020-04-19 ENCOUNTER — Other Ambulatory Visit: Payer: Self-pay

## 2020-04-19 ENCOUNTER — Ambulatory Visit: Payer: Medicare HMO | Admitting: Urology

## 2020-04-19 VITALS — BP 128/69 | HR 58 | Wt 154.0 lb

## 2020-04-19 DIAGNOSIS — R972 Elevated prostate specific antigen [PSA]: Secondary | ICD-10-CM

## 2020-06-09 DIAGNOSIS — M1611 Unilateral primary osteoarthritis, right hip: Secondary | ICD-10-CM | POA: Diagnosis not present

## 2020-06-09 DIAGNOSIS — Z79899 Other long term (current) drug therapy: Secondary | ICD-10-CM | POA: Diagnosis not present

## 2020-06-09 DIAGNOSIS — I10 Essential (primary) hypertension: Secondary | ICD-10-CM | POA: Diagnosis not present

## 2020-06-09 DIAGNOSIS — I739 Peripheral vascular disease, unspecified: Secondary | ICD-10-CM | POA: Diagnosis not present

## 2020-06-09 DIAGNOSIS — M47816 Spondylosis without myelopathy or radiculopathy, lumbar region: Secondary | ICD-10-CM | POA: Diagnosis not present

## 2020-06-09 DIAGNOSIS — M25551 Pain in right hip: Secondary | ICD-10-CM | POA: Diagnosis not present

## 2020-06-09 DIAGNOSIS — F0390 Unspecified dementia without behavioral disturbance: Secondary | ICD-10-CM | POA: Diagnosis not present

## 2020-06-09 DIAGNOSIS — I878 Other specified disorders of veins: Secondary | ICD-10-CM | POA: Diagnosis not present

## 2020-09-01 DIAGNOSIS — R8290 Unspecified abnormal findings in urine: Secondary | ICD-10-CM | POA: Diagnosis not present

## 2020-09-01 DIAGNOSIS — R634 Abnormal weight loss: Secondary | ICD-10-CM | POA: Diagnosis not present

## 2020-09-01 DIAGNOSIS — F0150 Vascular dementia without behavioral disturbance: Secondary | ICD-10-CM | POA: Diagnosis not present

## 2020-09-01 DIAGNOSIS — Z79899 Other long term (current) drug therapy: Secondary | ICD-10-CM | POA: Diagnosis not present

## 2020-09-01 DIAGNOSIS — R972 Elevated prostate specific antigen [PSA]: Secondary | ICD-10-CM | POA: Diagnosis not present

## 2020-09-01 DIAGNOSIS — D649 Anemia, unspecified: Secondary | ICD-10-CM | POA: Diagnosis not present

## 2020-09-01 DIAGNOSIS — I10 Essential (primary) hypertension: Secondary | ICD-10-CM | POA: Diagnosis not present

## 2020-09-08 DIAGNOSIS — E785 Hyperlipidemia, unspecified: Secondary | ICD-10-CM | POA: Diagnosis not present

## 2020-09-08 DIAGNOSIS — I2510 Atherosclerotic heart disease of native coronary artery without angina pectoris: Secondary | ICD-10-CM | POA: Diagnosis not present

## 2020-09-08 DIAGNOSIS — I10 Essential (primary) hypertension: Secondary | ICD-10-CM | POA: Diagnosis not present

## 2020-09-08 DIAGNOSIS — F0391 Unspecified dementia with behavioral disturbance: Secondary | ICD-10-CM | POA: Diagnosis not present

## 2020-09-08 DIAGNOSIS — D649 Anemia, unspecified: Secondary | ICD-10-CM | POA: Diagnosis not present

## 2020-09-08 DIAGNOSIS — Z0000 Encounter for general adult medical examination without abnormal findings: Secondary | ICD-10-CM | POA: Diagnosis not present

## 2020-09-08 DIAGNOSIS — Z79899 Other long term (current) drug therapy: Secondary | ICD-10-CM | POA: Diagnosis not present

## 2020-09-08 DIAGNOSIS — M25551 Pain in right hip: Secondary | ICD-10-CM | POA: Diagnosis not present

## 2020-09-08 DIAGNOSIS — R972 Elevated prostate specific antigen [PSA]: Secondary | ICD-10-CM | POA: Diagnosis not present

## 2020-10-21 DIAGNOSIS — E7800 Pure hypercholesterolemia, unspecified: Secondary | ICD-10-CM | POA: Diagnosis not present

## 2020-10-21 DIAGNOSIS — I10 Essential (primary) hypertension: Secondary | ICD-10-CM | POA: Diagnosis not present

## 2020-10-21 DIAGNOSIS — D649 Anemia, unspecified: Secondary | ICD-10-CM | POA: Diagnosis not present

## 2020-10-27 ENCOUNTER — Other Ambulatory Visit
Admission: RE | Admit: 2020-10-27 | Discharge: 2020-10-27 | Disposition: A | Discharge status: Home / Self Care | Payer: Medicare HMO | Source: Ambulatory Visit | Attending: Family Medicine | Admitting: Family Medicine

## 2020-10-27 DIAGNOSIS — I10 Essential (primary) hypertension: Secondary | ICD-10-CM | POA: Diagnosis not present

## 2020-10-27 DIAGNOSIS — R519 Headache, unspecified: Secondary | ICD-10-CM | POA: Diagnosis not present

## 2020-10-27 DIAGNOSIS — F0390 Unspecified dementia without behavioral disturbance: Secondary | ICD-10-CM | POA: Diagnosis not present

## 2020-10-27 DIAGNOSIS — R0789 Other chest pain: Secondary | ICD-10-CM | POA: Diagnosis not present

## 2020-10-27 DIAGNOSIS — I2510 Atherosclerotic heart disease of native coronary artery without angina pectoris: Secondary | ICD-10-CM | POA: Diagnosis not present

## 2020-10-27 LAB — TROPONIN I (HIGH SENSITIVITY): Troponin I (High Sensitivity): 11 ng/L (ref <18)

## 2020-10-31 DIAGNOSIS — E876 Hypokalemia: Secondary | ICD-10-CM | POA: Diagnosis not present

## 2020-11-09 DIAGNOSIS — I10 Essential (primary) hypertension: Secondary | ICD-10-CM | POA: Diagnosis not present

## 2020-11-09 DIAGNOSIS — R0602 Shortness of breath: Secondary | ICD-10-CM | POA: Diagnosis not present

## 2020-11-09 DIAGNOSIS — R001 Bradycardia, unspecified: Secondary | ICD-10-CM | POA: Diagnosis not present

## 2020-11-09 DIAGNOSIS — I25810 Atherosclerosis of coronary artery bypass graft(s) without angina pectoris: Secondary | ICD-10-CM | POA: Diagnosis not present

## 2020-11-09 DIAGNOSIS — I495 Sick sinus syndrome: Secondary | ICD-10-CM | POA: Diagnosis not present

## 2020-11-09 DIAGNOSIS — F0150 Vascular dementia without behavioral disturbance: Secondary | ICD-10-CM | POA: Diagnosis not present

## 2020-11-09 DIAGNOSIS — I2510 Atherosclerotic heart disease of native coronary artery without angina pectoris: Secondary | ICD-10-CM | POA: Diagnosis not present

## 2020-11-09 DIAGNOSIS — E7800 Pure hypercholesterolemia, unspecified: Secondary | ICD-10-CM | POA: Diagnosis not present

## 2020-12-19 DIAGNOSIS — R0602 Shortness of breath: Secondary | ICD-10-CM | POA: Diagnosis not present

## 2020-12-19 DIAGNOSIS — I25810 Atherosclerosis of coronary artery bypass graft(s) without angina pectoris: Secondary | ICD-10-CM | POA: Diagnosis not present

## 2020-12-27 DIAGNOSIS — I10 Essential (primary) hypertension: Secondary | ICD-10-CM | POA: Diagnosis not present

## 2020-12-27 DIAGNOSIS — I2510 Atherosclerotic heart disease of native coronary artery without angina pectoris: Secondary | ICD-10-CM | POA: Diagnosis not present

## 2020-12-27 DIAGNOSIS — R001 Bradycardia, unspecified: Secondary | ICD-10-CM | POA: Diagnosis not present

## 2020-12-27 DIAGNOSIS — E7800 Pure hypercholesterolemia, unspecified: Secondary | ICD-10-CM | POA: Diagnosis not present

## 2020-12-27 DIAGNOSIS — I495 Sick sinus syndrome: Secondary | ICD-10-CM | POA: Diagnosis not present

## 2020-12-27 DIAGNOSIS — F0150 Vascular dementia without behavioral disturbance: Secondary | ICD-10-CM | POA: Diagnosis not present

## 2020-12-27 DIAGNOSIS — R0602 Shortness of breath: Secondary | ICD-10-CM | POA: Diagnosis not present

## 2020-12-29 DIAGNOSIS — H524 Presbyopia: Secondary | ICD-10-CM | POA: Diagnosis not present

## 2021-05-04 DIAGNOSIS — Z0100 Encounter for examination of eyes and vision without abnormal findings: Secondary | ICD-10-CM | POA: Diagnosis not present

## 2021-05-14 ENCOUNTER — Emergency Department: Payer: Medicare HMO

## 2021-05-14 ENCOUNTER — Other Ambulatory Visit: Payer: Self-pay

## 2021-05-14 ENCOUNTER — Emergency Department
Admission: EM | Admit: 2021-05-14 | Discharge: 2021-05-14 | Discharge status: Against Medical Advice | Payer: Medicare HMO | Attending: Emergency Medicine | Admitting: Emergency Medicine

## 2021-05-14 DIAGNOSIS — R001 Bradycardia, unspecified: Secondary | ICD-10-CM | POA: Diagnosis not present

## 2021-05-14 DIAGNOSIS — R4182 Altered mental status, unspecified: Secondary | ICD-10-CM | POA: Diagnosis not present

## 2021-05-14 DIAGNOSIS — I10 Essential (primary) hypertension: Secondary | ICD-10-CM | POA: Insufficient documentation

## 2021-05-14 DIAGNOSIS — Z7982 Long term (current) use of aspirin: Secondary | ICD-10-CM | POA: Insufficient documentation

## 2021-05-14 DIAGNOSIS — R531 Weakness: Secondary | ICD-10-CM | POA: Diagnosis not present

## 2021-05-14 DIAGNOSIS — R0602 Shortness of breath: Secondary | ICD-10-CM | POA: Diagnosis not present

## 2021-05-14 DIAGNOSIS — Z87891 Personal history of nicotine dependence: Secondary | ICD-10-CM | POA: Diagnosis not present

## 2021-05-14 DIAGNOSIS — R404 Transient alteration of awareness: Secondary | ICD-10-CM | POA: Diagnosis not present

## 2021-05-14 DIAGNOSIS — I2510 Atherosclerotic heart disease of native coronary artery without angina pectoris: Secondary | ICD-10-CM | POA: Diagnosis not present

## 2021-05-14 DIAGNOSIS — Z85828 Personal history of other malignant neoplasm of skin: Secondary | ICD-10-CM | POA: Diagnosis not present

## 2021-05-14 DIAGNOSIS — Z79899 Other long term (current) drug therapy: Secondary | ICD-10-CM | POA: Insufficient documentation

## 2021-05-14 DIAGNOSIS — F0390 Unspecified dementia without behavioral disturbance: Secondary | ICD-10-CM | POA: Insufficient documentation

## 2021-05-14 DIAGNOSIS — R410 Disorientation, unspecified: Secondary | ICD-10-CM | POA: Diagnosis not present

## 2021-05-14 DIAGNOSIS — R918 Other nonspecific abnormal finding of lung field: Secondary | ICD-10-CM | POA: Diagnosis not present

## 2021-05-14 LAB — CBC WITH DIFFERENTIAL/PLATELET
Abs Immature Granulocytes: 0.01 10*3/uL (ref 0.00–0.07)
Basophils Absolute: 0 10*3/uL (ref 0.0–0.1)
Basophils Relative: 0 %
Eosinophils Absolute: 0.2 10*3/uL (ref 0.0–0.5)
Eosinophils Relative: 4 %
HCT: 40 % (ref 39.0–52.0)
Hemoglobin: 13.4 g/dL (ref 13.0–17.0)
Immature Granulocytes: 0 %
Lymphocytes Relative: 31 %
Lymphs Abs: 1.6 10*3/uL (ref 0.7–4.0)
MCH: 30.7 pg (ref 26.0–34.0)
MCHC: 33.5 g/dL (ref 30.0–36.0)
MCV: 91.7 fL (ref 80.0–100.0)
Monocytes Absolute: 0.5 10*3/uL (ref 0.1–1.0)
Monocytes Relative: 9 %
Neutro Abs: 2.9 10*3/uL (ref 1.7–7.7)
Neutrophils Relative %: 56 %
Platelets: 275 10*3/uL (ref 150–400)
RBC: 4.36 MIL/uL (ref 4.22–5.81)
RDW: 13.3 % (ref 11.5–15.5)
WBC: 5.2 10*3/uL (ref 4.0–10.5)
nRBC: 0 % (ref 0.0–0.2)

## 2021-05-14 LAB — BASIC METABOLIC PANEL
Anion gap: 6 (ref 5–15)
BUN: 11 mg/dL (ref 8–23)
CO2: 28 mmol/L (ref 22–32)
Calcium: 8.8 mg/dL — ABNORMAL LOW (ref 8.9–10.3)
Chloride: 105 mmol/L (ref 98–111)
Creatinine, Ser: 1.02 mg/dL (ref 0.61–1.24)
GFR, Estimated: >60 mL/min (ref >60)
Glucose, Bld: 111 mg/dL — ABNORMAL HIGH (ref 70–99)
Potassium: 3.7 mmol/L (ref 3.5–5.1)
Sodium: 139 mmol/L (ref 135–145)

## 2021-05-14 LAB — PROCALCITONIN: Procalcitonin: <0.1 ng/mL

## 2021-05-14 LAB — TROPONIN I (HIGH SENSITIVITY)
Troponin I (High Sensitivity): 13 ng/L (ref <18)
Troponin I (High Sensitivity): 11 ng/L (ref <18)

## 2021-05-14 NOTE — ED Notes (Signed)
Family and Pt refusing to stay and obtain a urine specimen from pt. When I came back to the room. Pt was and family was gone. MD advised he had spoken to them and they left. No signature obtained.

## 2021-05-14 NOTE — ED Notes (Signed)
RN to bedside. Pt was yelling. Multiple staff at bedside. Pt got aggressive with the MD and he had to be calmed down.

## 2021-05-14 NOTE — ED Provider Notes (Signed)
[facility] Emergency Department Provider Note ____________________________________________   Event Date/Time   First MD Initiated Contact with Patient 05/14/21 1240     (approximate)  I have reviewed the triage vital signs and the nursing notes.   HISTORY  Chief Complaint Altered Mental Status  Level 5 caveat: History of present illness limited due to dementia  HPI Marcus Shepherd is a 80 y.o. male with PMH as noted below who presents with an episode of altered mental status and subsequently shortness of breath, which has now resolved.  The wife is the primary historian.  She states that several days ago, the patient stated that he did not feel well but this seemed to resolve.  Today when he got up, he appeared more lethargic than normal.  Subsequently, he laid down on the bed and then started breathing very heavily as if he was having trouble breathing.  After this did not resolve within a few minutes, the wife called EMS.  Subsequently the normal breathing ceased.  The wife states that the patient is back to his baseline.  The patient himself is unable to give any relevant history and states he feels fine.   No past medical history on file.  Patient Active Problem List   Diagnosis Date Noted   Elevated PSA 09/07/2019   Sinus bradycardia 06/04/2018   Personal history of other malignant neoplasm of skin 12/11/2017   Osteoarthritis of left knee 01/30/2016   Anemia, unspecified 04/13/2014   Benign essential hypertension 04/13/2014   CAD (coronary artery disease), autologous vein bypass graft 04/13/2014   Neck pain 04/13/2014   Sinoatrial node dysfunction ([place]) 04/13/2014   CAD (coronary artery disease), native coronary artery 12/31/2013   Dementia ([place]) 12/31/2013   HTN (hypertension) 12/31/2013   Hyperlipidemia, unspecified 12/31/2013    Past Surgical History:  Procedure Laterality Date   CARDIAC SURGERY     cardiac stent 2001    Prior to  Admission medications   Medication Sig Start Date End Date Taking? Authorizing Provider  aspirin 325 MG EC tablet Take 325 mg by mouth daily.    [provider]  cyanocobalamin 1000 MCG tablet Take by mouth.    [provider]  donepezil (ARICEPT) 10 MG tablet Take 10 mg by mouth at bedtime.    [provider]  memantine (NAMENDA) 10 MG tablet Take 10 mg by mouth 2 (two) times daily. 02/18/20   [provider]  Multiple Vitamin (MULTIVITAMIN WITH MINERALS) TABS tablet Take 1 tablet by mouth daily.    [provider]  omeprazole (PRILOSEC) 40 MG capsule Take 40 mg by mouth daily.    [provider]  pantoprazole (PROTONIX) 40 MG tablet Take 40 mg by mouth daily. 02/18/20   [provider]  ramipril (ALTACE) 5 MG capsule Take 5 mg by mouth daily.    [provider]  sertraline (ZOLOFT) 25 MG tablet  02/09/20   [provider]  simvastatin (ZOCOR) 80 MG tablet Take 80 mg by mouth at bedtime.    [provider]  traZODone (DESYREL) 50 MG tablet Take by mouth. 09/07/19 09/06/20  [provider]    Allergies Patient has no known allergies.  No family history on file.  Social History Social History   Tobacco Use   Smoking status: Former    Types: Cigarettes   Smokeless tobacco: Never  Substance Use Topics   Alcohol use: No   Drug use: No    Review of  Systems Level 5 COVID: Unable to obtain review of systems due to dementia    ____________________________________________   PHYSICAL EXAM:  VITAL SIGNS: ED Triage Vitals  Enc Vitals Group     BP 05/14/21 1233 (!) 158/62     Pulse Rate 05/14/21 1233 (!) 50     Resp 05/14/21 1233 16     Temp 05/14/21 1233 97.6 F (36.4 C)     Temp Source 05/14/21 1233 Oral     SpO2 05/14/21 1233 100 %     Weight 05/14/21 1234 154 lb 5.2 oz (70 kg)     Height 05/14/21 1234 5\' 10"  (1.778 m)     Head Circumference --      Peak Flow --      Pain Score  05/14/21 1234 0     Pain Loc --      Pain Edu? --      Excl. in [place]? --     Constitutional: Alert, confused.  Relatively well appearing and in no acute distress. Eyes: Conjunctivae are normal.  EOMI.  PERRLA. Head: Atraumatic. Nose: No congestion/rhinnorhea. Mouth/Throat: Mucous membranes are moist.   Neck: Normal range of motion.  Cardiovascular: Normal rate, regular rhythm. Grossly normal heart sounds.  Good peripheral circulation. Respiratory: Normal respiratory effort.  No retractions. Lungs CTAB. Gastrointestinal: Soft and nontender. No distention.  Genitourinary: No CVA tenderness. Musculoskeletal: No lower extremity edema.  Extremities warm and well perfused.  Neurologic:  Normal speech and language.  Motor intact in all extremities. Skin:  Skin is warm and dry. No rash noted. Psychiatric: Intermittently agitated but verbally redirectable and generally calm.  ____________________________________________   LABS (all labs ordered are listed, but only abnormal results are displayed)  Labs Reviewed  BASIC METABOLIC PANEL - Abnormal; Notable for the following components:      Result Value   Glucose, Bld 111 (*)    Calcium 8.8 (*)    All other components within normal limits  CBC WITH DIFFERENTIAL/PLATELET  URINALYSIS, COMPLETE (UACMP) WITH MICROSCOPIC  TROPONIN I (HIGH SENSITIVITY)  TROPONIN I (HIGH SENSITIVITY)   ____________________________________________  EKG  ED ECG REPORT I, Arta Silence, the attending physician, personally viewed and interpreted this ECG.  Date: 05/14/2021 EKG Time: 1237 Rate: 51 Rhythm: normal sinus rhythm QRS Axis: normal Intervals: Nonspecific IVCD ST/T Wave abnormalities: normal Narrative Interpretation: no evidence of acute ischemia; no prior EKG available for comparison  ____________________________________________  RADIOLOGY  Chest x-ray interpreted by me shows nonspecific lower lung  opacities  ____________________________________________   PROCEDURES  Procedure(s) performed: No  Procedures  Critical Care performed: No ____________________________________________   INITIAL IMPRESSION / ASSESSMENT AND PLAN / ED COURSE  Pertinent labs & imaging results that were available during my care of the patient were reviewed by me and considered in my medical decision making (see chart for details).   80 year old male with a history of dementia, hypertension, CAD and other PMH as noted above presents with a brief episode of altered mental status, appearing more lethargic than baseline, followed by an episode of abnormal labored breathing that resolved spontaneously.  Per his wife, the patient is now back to his baseline both in terms of mental status and respiratory effort.  I reviewed the past medical records in epic, however the patient has not been seen in the ED here or admitted to since 2017.  On exam he is overall well-appearing.  His vital signs are normal except for mild hypertension.  Neurologic exam is nonfocal.  The remainder  of the physical exam is unremarkable for any acute findings.  Differential for the patient's symptoms is broad, however I am reassured that he is back to his baseline and has no further respiratory symptoms.  Differential includes anxiety, acute bronchitis or reactive airways, UTI, pneumonia or other acute infection, or less likely cardiac cause.  Given the nonfocal neuro exam and baseline mental status there is no indication for brain imaging.  We will obtain a chest x-ray and lab work-up including cardiac enzymes, urinalysis, and reassess.  The wife agrees with this plan.  ----------------------------------------- 3:35 PM on 05/14/2021 -----------------------------------------  The patient has not had any recurrence of his symptoms.  The work-up is unremarkable.  Urinalysis and repeat troponin are pending.  I have signed the patient out to  the oncoming ED physician Dr. Archie Balboa.  I anticipate discharge home if the remainder of the work-up is negative.  ____________________________________________   FINAL CLINICAL IMPRESSION(S) / ED DIAGNOSES  Final diagnoses:  Altered mental status, unspecified altered mental status type      NEW MEDICATIONS STARTED DURING THIS VISIT:  New Prescriptions   No medications on file     Note:  This document was prepared using Dragon voice recognition software and may include unintentional dictation errors.    Arta Silence, MD 05/14/21 1537

## 2021-05-14 NOTE — ED Provider Notes (Signed)
Patient's son asked for discharge. States that he does not think patient will be able to provide a urine sample. I did discuss that we were concerned this could have been the cause of the patient's altered behavior. Did encourage them to contact PCP and that PCP could potentially order UA if a sample was obtained at home.   Nance Pear, MD 05/14/21 [phone]

## 2021-05-14 NOTE — ED Triage Notes (Signed)
BIB [facility] from home. Wife called 911 was concerned  pt was breathing normally.  HX of dementia. Non compliance with meds for months per wife. Vitals WNL for ems.   99% RA GCS 14

## 2021-06-21 DIAGNOSIS — E7800 Pure hypercholesterolemia, unspecified: Secondary | ICD-10-CM | POA: Diagnosis not present

## 2021-06-21 DIAGNOSIS — I10 Essential (primary) hypertension: Secondary | ICD-10-CM | POA: Diagnosis not present

## 2021-06-21 DIAGNOSIS — R001 Bradycardia, unspecified: Secondary | ICD-10-CM | POA: Diagnosis not present

## 2021-06-21 DIAGNOSIS — R0602 Shortness of breath: Secondary | ICD-10-CM | POA: Diagnosis not present

## 2021-06-21 DIAGNOSIS — I2510 Atherosclerotic heart disease of native coronary artery without angina pectoris: Secondary | ICD-10-CM | POA: Diagnosis not present

## 2021-06-21 DIAGNOSIS — Z23 Encounter for immunization: Secondary | ICD-10-CM | POA: Diagnosis not present

## 2021-07-14 DIAGNOSIS — E7800 Pure hypercholesterolemia, unspecified: Secondary | ICD-10-CM | POA: Diagnosis not present

## 2021-07-14 DIAGNOSIS — Z125 Encounter for screening for malignant neoplasm of prostate: Secondary | ICD-10-CM | POA: Diagnosis not present

## 2021-07-14 DIAGNOSIS — I10 Essential (primary) hypertension: Secondary | ICD-10-CM | POA: Diagnosis not present

## 2021-07-14 DIAGNOSIS — I2510 Atherosclerotic heart disease of native coronary artery without angina pectoris: Secondary | ICD-10-CM | POA: Diagnosis not present

## 2021-07-14 DIAGNOSIS — F0150 Vascular dementia without behavioral disturbance: Secondary | ICD-10-CM | POA: Diagnosis not present

## 2021-07-14 DIAGNOSIS — R972 Elevated prostate specific antigen [PSA]: Secondary | ICD-10-CM | POA: Diagnosis not present

## 2021-07-14 DIAGNOSIS — R739 Hyperglycemia, unspecified: Secondary | ICD-10-CM | POA: Diagnosis not present

## 2021-07-14 DIAGNOSIS — R8290 Unspecified abnormal findings in urine: Secondary | ICD-10-CM | POA: Diagnosis not present

## 2021-07-14 DIAGNOSIS — M25551 Pain in right hip: Secondary | ICD-10-CM | POA: Diagnosis not present

## 2021-07-14 DIAGNOSIS — D649 Anemia, unspecified: Secondary | ICD-10-CM | POA: Diagnosis not present

## 2021-07-21 DIAGNOSIS — D649 Anemia, unspecified: Secondary | ICD-10-CM | POA: Diagnosis not present

## 2021-07-21 DIAGNOSIS — R972 Elevated prostate specific antigen [PSA]: Secondary | ICD-10-CM | POA: Diagnosis not present

## 2021-07-21 DIAGNOSIS — Z79899 Other long term (current) drug therapy: Secondary | ICD-10-CM | POA: Diagnosis not present

## 2021-07-21 DIAGNOSIS — I10 Essential (primary) hypertension: Secondary | ICD-10-CM | POA: Diagnosis not present

## 2021-07-21 DIAGNOSIS — Z0000 Encounter for general adult medical examination without abnormal findings: Secondary | ICD-10-CM | POA: Diagnosis not present

## 2021-07-21 DIAGNOSIS — F0390 Unspecified dementia without behavioral disturbance: Secondary | ICD-10-CM | POA: Diagnosis not present

## 2021-07-21 DIAGNOSIS — R7309 Other abnormal glucose: Secondary | ICD-10-CM | POA: Diagnosis not present

## 2021-07-25 ENCOUNTER — Emergency Department: Payer: Medicare HMO

## 2021-07-25 ENCOUNTER — Inpatient Hospital Stay: Payer: Medicare HMO

## 2021-07-25 ENCOUNTER — Encounter: Payer: Self-pay | Admitting: Family Medicine

## 2021-07-25 ENCOUNTER — Other Ambulatory Visit: Payer: Self-pay

## 2021-07-25 ENCOUNTER — Inpatient Hospital Stay
Admission: EM | Admit: 2021-07-25 | Discharge: 2021-08-01 | DRG: 480 | Disposition: A | Discharge status: Hospice | Payer: Medicare HMO | Attending: Internal Medicine | Admitting: Internal Medicine

## 2021-07-25 DIAGNOSIS — E43 Unspecified severe protein-calorie malnutrition: Secondary | ICD-10-CM | POA: Diagnosis not present

## 2021-07-25 DIAGNOSIS — Z955 Presence of coronary angioplasty implant and graft: Secondary | ICD-10-CM

## 2021-07-25 DIAGNOSIS — E785 Hyperlipidemia, unspecified: Secondary | ICD-10-CM | POA: Diagnosis present

## 2021-07-25 DIAGNOSIS — F02C3 Dementia in other diseases classified elsewhere, severe, with mood disturbance: Secondary | ICD-10-CM | POA: Diagnosis not present

## 2021-07-25 DIAGNOSIS — F32A Depression, unspecified: Secondary | ICD-10-CM | POA: Diagnosis not present

## 2021-07-25 DIAGNOSIS — Z7982 Long term (current) use of aspirin: Secondary | ICD-10-CM | POA: Diagnosis not present

## 2021-07-25 DIAGNOSIS — Y92009 Unspecified place in unspecified non-institutional (private) residence as the place of occurrence of the external cause: Secondary | ICD-10-CM | POA: Diagnosis not present

## 2021-07-25 DIAGNOSIS — S72009A Fracture of unspecified part of neck of unspecified femur, initial encounter for closed fracture: Secondary | ICD-10-CM | POA: Diagnosis present

## 2021-07-25 DIAGNOSIS — S72142A Displaced intertrochanteric fracture of left femur, initial encounter for closed fracture: Secondary | ICD-10-CM | POA: Diagnosis not present

## 2021-07-25 DIAGNOSIS — I10 Essential (primary) hypertension: Secondary | ICD-10-CM | POA: Diagnosis present

## 2021-07-25 DIAGNOSIS — Z91130 Patient's unintentional underdosing of medication regimen due to age-related debility: Secondary | ICD-10-CM | POA: Diagnosis not present

## 2021-07-25 DIAGNOSIS — K219 Gastro-esophageal reflux disease without esophagitis: Secondary | ICD-10-CM | POA: Diagnosis present

## 2021-07-25 DIAGNOSIS — W19XXXA Unspecified fall, initial encounter: Secondary | ICD-10-CM | POA: Diagnosis present

## 2021-07-25 DIAGNOSIS — G309 Alzheimer's disease, unspecified: Secondary | ICD-10-CM | POA: Diagnosis not present

## 2021-07-25 DIAGNOSIS — G4700 Insomnia, unspecified: Secondary | ICD-10-CM | POA: Diagnosis present

## 2021-07-25 DIAGNOSIS — F03918 Unspecified dementia, unspecified severity, with other behavioral disturbance: Secondary | ICD-10-CM | POA: Diagnosis not present

## 2021-07-25 DIAGNOSIS — Z79899 Other long term (current) drug therapy: Secondary | ICD-10-CM

## 2021-07-25 DIAGNOSIS — Z20822 Contact with and (suspected) exposure to covid-19: Secondary | ICD-10-CM | POA: Diagnosis not present

## 2021-07-25 DIAGNOSIS — F0390 Unspecified dementia without behavioral disturbance: Secondary | ICD-10-CM | POA: Diagnosis not present

## 2021-07-25 DIAGNOSIS — M25552 Pain in left hip: Secondary | ICD-10-CM | POA: Diagnosis not present

## 2021-07-25 DIAGNOSIS — Z6822 Body mass index (BMI) 22.0-22.9, adult: Secondary | ICD-10-CM

## 2021-07-25 DIAGNOSIS — I25810 Atherosclerosis of coronary artery bypass graft(s) without angina pectoris: Secondary | ICD-10-CM | POA: Diagnosis not present

## 2021-07-25 DIAGNOSIS — Z9114 Patient's other noncompliance with medication regimen: Secondary | ICD-10-CM

## 2021-07-25 DIAGNOSIS — Z515 Encounter for palliative care: Secondary | ICD-10-CM | POA: Diagnosis not present

## 2021-07-25 DIAGNOSIS — I25719 Atherosclerosis of autologous vein coronary artery bypass graft(s) with unspecified angina pectoris: Secondary | ICD-10-CM | POA: Diagnosis not present

## 2021-07-25 DIAGNOSIS — S72002A Fracture of unspecified part of neck of left femur, initial encounter for closed fracture: Secondary | ICD-10-CM | POA: Diagnosis not present

## 2021-07-25 DIAGNOSIS — K5790 Diverticulosis of intestine, part unspecified, without perforation or abscess without bleeding: Secondary | ICD-10-CM | POA: Diagnosis present

## 2021-07-25 DIAGNOSIS — R404 Transient alteration of awareness: Secondary | ICD-10-CM | POA: Diagnosis not present

## 2021-07-25 DIAGNOSIS — S7292XA Unspecified fracture of left femur, initial encounter for closed fracture: Secondary | ICD-10-CM

## 2021-07-25 DIAGNOSIS — R52 Pain, unspecified: Secondary | ICD-10-CM | POA: Diagnosis not present

## 2021-07-25 HISTORY — DX: Shortness of breath: R06.02

## 2021-07-25 HISTORY — DX: Atherosclerotic heart disease of native coronary artery without angina pectoris: I25.10

## 2021-07-25 HISTORY — DX: Anemia, unspecified: D64.9

## 2021-07-25 HISTORY — DX: Unspecified dementia, unspecified severity, without behavioral disturbance, psychotic disturbance, mood disturbance, and anxiety: F03.90

## 2021-07-25 HISTORY — DX: Bradycardia, unspecified: R00.1

## 2021-07-25 HISTORY — DX: Essential (primary) hypertension: I10

## 2021-07-25 HISTORY — DX: Elevated prostate specific antigen (PSA): R97.20

## 2021-07-25 LAB — RESP PANEL BY RT-PCR (FLU A&B, COVID) ARPGX2
Influenza A by PCR: NEGATIVE
Influenza B by PCR: NEGATIVE
SARS Coronavirus 2 by RT PCR: NEGATIVE

## 2021-07-25 LAB — CBC WITH DIFFERENTIAL/PLATELET
Abs Immature Granulocytes: 0.03 10*3/uL (ref 0.00–0.07)
Basophils Absolute: 0 10*3/uL (ref 0.0–0.1)
Basophils Relative: 0 %
Eosinophils Absolute: 0.2 10*3/uL (ref 0.0–0.5)
Eosinophils Relative: 3 %
HCT: 37.1 % — ABNORMAL LOW (ref 39.0–52.0)
Hemoglobin: 12.6 g/dL — ABNORMAL LOW (ref 13.0–17.0)
Immature Granulocytes: 0 %
Lymphocytes Relative: 13 %
Lymphs Abs: 0.9 10*3/uL (ref 0.7–4.0)
MCH: 31.1 pg (ref 26.0–34.0)
MCHC: 34 g/dL (ref 30.0–36.0)
MCV: 91.6 fL (ref 80.0–100.0)
Monocytes Absolute: 0.5 10*3/uL (ref 0.1–1.0)
Monocytes Relative: 8 %
Neutro Abs: 5.1 10*3/uL (ref 1.7–7.7)
Neutrophils Relative %: 76 %
Platelets: 223 10*3/uL (ref 150–400)
RBC: 4.05 MIL/uL — ABNORMAL LOW (ref 4.22–5.81)
RDW: 13.2 % (ref 11.5–15.5)
WBC: 6.8 10*3/uL (ref 4.0–10.5)
nRBC: 0 % (ref 0.0–0.2)

## 2021-07-25 LAB — COMPREHENSIVE METABOLIC PANEL
ALT: 7 U/L (ref 0–44)
AST: 19 U/L (ref 15–41)
Albumin: 3.1 g/dL — ABNORMAL LOW (ref 3.5–5.0)
Alkaline Phosphatase: 55 U/L (ref 38–126)
Anion gap: 5 (ref 5–15)
BUN: 9 mg/dL (ref 8–23)
CO2: 25 mmol/L (ref 22–32)
Calcium: 8.5 mg/dL — ABNORMAL LOW (ref 8.9–10.3)
Chloride: 106 mmol/L (ref 98–111)
Creatinine, Ser: 1.07 mg/dL (ref 0.61–1.24)
GFR, Estimated: >60 mL/min (ref >60)
Glucose, Bld: 118 mg/dL — ABNORMAL HIGH (ref 70–99)
Potassium: 3.6 mmol/L (ref 3.5–5.1)
Sodium: 136 mmol/L (ref 135–145)
Total Bilirubin: 0.8 mg/dL (ref 0.3–1.2)
Total Protein: 5.8 g/dL — ABNORMAL LOW (ref 6.5–8.1)

## 2021-07-25 LAB — TROPONIN I (HIGH SENSITIVITY)
Troponin I (High Sensitivity): 13 ng/L (ref <18)
Troponin I (High Sensitivity): 18 ng/L — ABNORMAL HIGH (ref <18)

## 2021-07-25 LAB — TYPE AND SCREEN
ABO/RH(D): A NEG
Antibody Screen: NEGATIVE

## 2021-07-25 LAB — GLUCOSE, CAPILLARY: Glucose-Capillary: 103 mg/dL — ABNORMAL HIGH (ref 70–99)

## 2021-07-25 MED ORDER — ONDANSETRON HCL 4 MG PO TABS: 4.0000 mg | ORAL_TABLET | Freq: Four times a day (QID) | ORAL | Status: DC | PRN

## 2021-07-25 MED ORDER — PANTOPRAZOLE SODIUM 40 MG PO TBEC
40.0000 mg | DELAYED_RELEASE_TABLET | Freq: Every day | ORAL | Status: DC
  Filled 2021-07-25 (×5): qty 1

## 2021-07-25 MED ORDER — LORAZEPAM 2 MG/ML IJ SOLN
2.0000 mg | Freq: Once | INTRAMUSCULAR | Status: AC
  Administered 2021-07-25: 10:00:00 2 mg via INTRAMUSCULAR
  Filled 2021-07-25: qty 1

## 2021-07-25 MED ORDER — HYDROCODONE-ACETAMINOPHEN 5-325 MG PO TABS
1.0000 | ORAL_TABLET | ORAL | Status: DC | PRN
  Administered 2021-07-30 (×2): 1 via ORAL
  Filled 2021-07-25 (×3): qty 1
  Filled 2021-07-25: qty 2

## 2021-07-25 MED ORDER — HYDROMORPHONE HCL 1 MG/ML IJ SOLN
1.0000 mg | Freq: Once | INTRAMUSCULAR | Status: AC
Start: 2021-07-25 — End: 2021-07-25
  Administered 2021-07-25: 10:00:00 1 mg via INTRAVENOUS
  Filled 2021-07-25: qty 1

## 2021-07-25 MED ORDER — ADULT MULTIVITAMIN W/MINERALS CH
1.0000 | ORAL_TABLET | Freq: Every day | ORAL | Status: DC
  Administered 2021-07-27: 11:00:00 1 via ORAL
  Filled 2021-07-25 (×5): qty 1

## 2021-07-25 MED ORDER — LORAZEPAM 2 MG/ML IJ SOLN
0.5000 mg | Freq: Four times a day (QID) | INTRAMUSCULAR | Status: DC | PRN
  Administered 2021-07-25 – 2021-07-26 (×3): 0.5 mg via INTRAVENOUS
  Filled 2021-07-25 (×3): qty 1

## 2021-07-25 MED ORDER — HALOPERIDOL LACTATE 5 MG/ML IJ SOLN: 2.0000 mg | Freq: Four times a day (QID) | INTRAMUSCULAR | Status: DC | PRN

## 2021-07-25 MED ORDER — LACTATED RINGERS IV SOLN: INTRAVENOUS | Status: DC

## 2021-07-25 MED ORDER — MEMANTINE HCL 5 MG PO TABS
10.0000 mg | ORAL_TABLET | Freq: Two times a day (BID) | ORAL | Status: DC
  Administered 2021-07-27 – 2021-07-30 (×4): 10 mg via ORAL
  Filled 2021-07-25 (×9): qty 2

## 2021-07-25 MED ORDER — TRAZODONE HCL 50 MG PO TABS
50.0000 mg | ORAL_TABLET | Freq: Every day | ORAL | Status: DC
  Administered 2021-07-27 – 2021-07-29 (×2): 50 mg via ORAL
  Filled 2021-07-25 (×4): qty 1

## 2021-07-25 MED ORDER — FENTANYL CITRATE PF 50 MCG/ML IJ SOSY
12.5000 ug | PREFILLED_SYRINGE | INTRAMUSCULAR | Status: DC | PRN
  Administered 2021-07-25: 17:00:00 12.5 ug via INTRAVENOUS
  Administered 2021-07-25: 19:00:00 25 ug via INTRAVENOUS
  Filled 2021-07-25 (×2): qty 1

## 2021-07-25 MED ORDER — SERTRALINE HCL 50 MG PO TABS
25.0000 mg | ORAL_TABLET | Freq: Every day | ORAL | Status: DC
  Administered 2021-07-27 – 2021-07-30 (×2): 25 mg via ORAL
  Filled 2021-07-25 (×5): qty 1

## 2021-07-25 MED ORDER — CEFAZOLIN SODIUM-DEXTROSE 1-4 GM/50ML-% IV SOLN
1.0000 g | INTRAVENOUS | Status: AC
  Administered 2021-07-26: 16:00:00 1 g via INTRAVENOUS

## 2021-07-25 MED ORDER — ASPIRIN EC 81 MG PO TBEC
81.0000 mg | DELAYED_RELEASE_TABLET | Freq: Every day | ORAL | Status: DC
  Administered 2021-07-27: 10:00:00 81 mg via ORAL
  Filled 2021-07-25 (×6): qty 1

## 2021-07-25 MED ORDER — ATORVASTATIN CALCIUM 20 MG PO TABS
40.0000 mg | ORAL_TABLET | Freq: Every evening | ORAL | Status: DC
  Administered 2021-07-29: 18:00:00 40 mg via ORAL
  Filled 2021-07-25 (×2): qty 2

## 2021-07-25 MED ORDER — POTASSIUM CHLORIDE CRYS ER 10 MEQ PO TBCR
10.0000 meq | EXTENDED_RELEASE_TABLET | Freq: Every day | ORAL | Status: DC
  Administered 2021-07-27: 10:00:00 10 meq via ORAL
  Filled 2021-07-25 (×5): qty 1

## 2021-07-25 MED ORDER — ONDANSETRON HCL 4 MG/2ML IJ SOLN: 4.0000 mg | Freq: Four times a day (QID) | INTRAMUSCULAR | Status: DC | PRN

## 2021-07-25 MED ORDER — ACETAMINOPHEN 650 MG RE SUPP: 650.0000 mg | Freq: Four times a day (QID) | RECTAL | Status: DC | PRN

## 2021-07-25 MED ORDER — VITAMIN B-12 1000 MCG PO TABS
1000.0000 ug | ORAL_TABLET | Freq: Every day | ORAL | Status: DC
  Administered 2021-07-27: 10:00:00 1000 ug via ORAL
  Filled 2021-07-25 (×6): qty 1

## 2021-07-25 MED ORDER — RAMIPRIL 5 MG PO CAPS
5.0000 mg | ORAL_CAPSULE | Freq: Every day | ORAL | Status: DC
  Administered 2021-07-27: 10:00:00 5 mg via ORAL
  Filled 2021-07-25 (×5): qty 1

## 2021-07-25 MED ORDER — DONEPEZIL HCL 5 MG PO TABS
10.0000 mg | ORAL_TABLET | Freq: Every day | ORAL | Status: DC
  Administered 2021-07-27 – 2021-07-29 (×2): 10 mg via ORAL
  Filled 2021-07-25 (×4): qty 2

## 2021-07-25 MED ORDER — ACETAMINOPHEN 325 MG PO TABS: 650.0000 mg | ORAL_TABLET | Freq: Four times a day (QID) | ORAL | Status: DC | PRN

## 2021-07-25 NOTE — Progress Notes (Signed)
PT Cancellation Note  Patient Details Name: Marcus Shepherd MRN: [id number] DOB: [birth year]   Cancelled Treatment:    Reason Eval/Treat Not Completed: Medical issues which prohibited therapy.  Pt currently has orthopaedic consult ordered at this time due to fall and L femur fx.  Will hold until POC established.   Gwenlyn Saran, PT, DPT 07/25/21, 3:41 PM

## 2021-07-25 NOTE — ED Notes (Signed)
Attending notified via secure chat that charge RN on floor requesting rest of floor orders be placed. Attending stated will complete soon.

## 2021-07-25 NOTE — ED Triage Notes (Signed)
Pt brought in by [facility] from home, lives with wife. She found him on face down pt co left hip pain. No other obvious deformity or injury. Pt is demented and not able to answer questions, wife states mentation is normal. Pt is aggressive towards EMS when in pain, wife states has been normal in the past but not to this degree of agitation.

## 2021-07-25 NOTE — ED Notes (Signed)
Spoke with floor charge RN who states to send the pt to the floor. The receiving nurse is on lunch. This RN told Clarene Critchley, Agricultural consultant that if the receiving RN has any questions she can reach out to this RN.  Colletta Maryland, ED charge RN aware.

## 2021-07-25 NOTE — ED Notes (Signed)
Unable to get VS or perform any other tasks or assessments at this time due to aggression. Meds given as ordered.

## 2021-07-25 NOTE — H&P (Signed)
H&P:    Marcus Shepherd   UXN:[id number] DOB: [birth year] DOA: 07/25/2021  PCP: Tracie Harrier, MD  Chief Complaint: Left hip pain   History of Present Illness:    HPI: Marcus Shepherd is a 80 y.o. male with a past medical history of coronary artery artery disease, hypertension, sinus bradycardia, hyperlipidemia, advanced dementia, GERD.  This patient presented to the ER via EMS after a fall at home.  Has dementia at baseline.  EMS stated that the patient has been in severe pain and was combative on the way to the hospital.  They were unable to obtain IV access to administer pain medication prior to arrival.  His wife is in the room who assisted with the history.  He has been unable to stand since he fell.  No head injury.  Complaining of left hip pain.  Was given Dilaudid and Ativan for his pain and agitation.  Now sedated on my exam.  Wife states that he has not been taking his medications as prescribed due to his dementia.  Has been several months since he has taken any of his medications.  ER provider did discuss with Ortho, Dr. Sharlet Salina.  They plan hip repair tomorrow.  N.p.o. after midnight.  ED Course: CT head shows a displaced intertrochanteric fracture of the left femur with superolateral displacement of the distal femur.  Mentioned above he has been given Dilaudid 1 mg IV and Ativan 2 mg IM for pain and agitation.  EKG shows sinus rhythm with PVCs, rate 60.  Labs are fairly unremarkable.  Vital signs are stable with baseline known sinus bradycardia.     ROS:   Unable to obtain 14 point review of systems due to patient being sedated.   Past Medical History:   History reviewed. No pertinent past medical history.  Past Surgical History:   Past Surgical History:  Procedure Laterality Date   CARDIAC SURGERY     cardiac stent 2001    Social History:   Social History   Socioeconomic History   Marital status: Married    Spouse name: Not on file   Number  of children: Not on file   Years of education: Not on file   Highest education level: Not on file  Occupational History   Not on file  Tobacco Use   Smoking status: Former    Types: Cigarettes   Smokeless tobacco: Never  Substance and Sexual Activity   Alcohol use: No   Drug use: No   Sexual activity: Not on file  Other Topics Concern   Not on file  Social History Narrative   Not on file   Social Determinants of Health   Financial Resource Strain: Not on file  Food Insecurity: Not on file  Transportation Needs: Not on file  Physical Activity: Not on file  Stress: Not on file  Social Connections: Not on file  Intimate Partner Violence: Not on file    Allergies:   No Known Allergies  Family History:   History reviewed. No pertinent family history.   Current Medications:   Prior to Admission medications   Medication Sig Start Date End Date Taking? Authorizing Provider  aspirin 325 MG EC tablet Take 325 mg by mouth daily. Patient not taking: Reported on 07/25/2021    [provider]  aspirin 81 MG EC tablet Take 1 tablet by mouth daily. Patient not taking: Reported on 07/25/2021    [provider]  cyanocobalamin 1000 MCG tablet Take by  mouth. Patient not taking: Reported on 07/25/2021    [provider]  donepezil (ARICEPT) 10 MG tablet Take 10 mg by mouth at bedtime. Patient not taking: Reported on 07/25/2021    [provider]  memantine (NAMENDA) 10 MG tablet Take 10 mg by mouth 2 (two) times daily. Patient not taking: Reported on 07/25/2021 02/18/20   [provider]  Multiple Vitamin (MULTIVITAMIN WITH MINERALS) TABS tablet Take 1 tablet by mouth daily. Patient not taking: Reported on 07/25/2021    [provider]  omeprazole (PRILOSEC) 40 MG capsule Take 40 mg by mouth daily. Patient not taking: Reported on 07/25/2021    [provider]  pantoprazole (PROTONIX) 40 MG tablet Take 40 mg by mouth  daily. Patient not taking: Reported on 07/25/2021 02/18/20   [provider]  potassium chloride (KLOR-CON) 10 MEQ tablet Take 1 tablet by mouth daily. Patient not taking: Reported on 07/25/2021 12/26/20   [provider]  ramipril (ALTACE) 5 MG capsule Take 5 mg by mouth daily. Patient not taking: Reported on 07/25/2021    [provider]  sertraline (ZOLOFT) 25 MG tablet  02/09/20   [provider]  simvastatin (ZOCOR) 80 MG tablet Take 80 mg by mouth at bedtime. Patient not taking: Reported on 07/25/2021    [provider]  traZODone (DESYREL) 50 MG tablet Take by mouth. 09/07/19 09/06/20  [provider]     Physical Exam:   Vitals:   07/25/21 1245 07/25/21 1300 07/25/21 1315 07/25/21 1317  BP:  (!) 127/48    Pulse: (!) 49 (!) 48 (!) 50   Resp: 17 12    Temp:    97.9 F (36.6 C)  TempSrc:    Axillary  SpO2: 100% 100% 97%   Weight:         General:  Appears calm and comfortable and is in NAD Cardiovascular: Regular rhythm with irregular rate Respiratory:   CTA bilaterally with no wheezes/rales/rhonchi.  Normal respiratory effort. Abdomen:  soft, NT, ND, NABS Skin:  no rash or induration seen on limited exam Musculoskeletal:  grossly normal tone BUE/BLE, good ROM, no bony abnormality Lower extremity:  No LE edema.  Limited foot exam with no ulcerations.  2+ distal pulses.  Left lower extremity shortened and externally rotated Psychiatric: Sedated Neurologic: Unable to obtain    Data Review:    Radiological Exams on Admission: Independently reviewed - see discussion in A/P where applicable  DG Pelvis 1-2 Views  Result Date: 07/25/2021 CLINICAL DATA:  Fall, hip pain EXAM: PELVIS - 1-2 VIEW COMPARISON:  None. FINDINGS: Acute intertrochanteric fracture of the left femur partially visualized. No acute fracture identified in the pelvis. Bones are osteopenic. IMPRESSION: Acute intertrochanteric fracture of the left femur.  Electronically Signed   By: Ofilia Neas M.D.   On: 07/25/2021 11:13   DG Femur Min 2 Views Left  Result Date: 07/25/2021 CLINICAL DATA:  Fall, hip pain EXAM: LEFT FEMUR 2 VIEWS COMPARISON:  None. FINDINGS: Acute intertrochanteric fracture of the left femur with up to 12 mm displacement at the lesser trochanter. No fracture identified in the more distal femur. Bones are osteopenic. Arterial vascular calcifications noted. IMPRESSION: Acute intertrochanteric fracture of the left femur. Electronically Signed   By: Ofilia Neas M.D.   On: 07/25/2021 11:13    EKG: Independently reviewed.  Sinus bradycardia with occasional PVCs, rate 60   Labs on Admission: I have personally reviewed the available labs and imaging studies at the time  of the admission.  Pertinent labs on Admission: Hemoglobin 12.6, hematocrit 37, initial troponin 13, blood glucose 118     Assessment/Plan:    Left hip fracture: Imaging demonstrates an acute intertrochanteric left femur fracture.  N.p.o. after midnight.  Orthopedics made aware by ED provider.  Pain medications as needed.  PT/OT and TOC consulted. Fall precautions ordered.  Agitation: Ativan as needed and Haldol as needed  Coronary artery disease: Resume home aspirin and Zocor  Dyslipidemia: Resume home Zocor  Hypertension: Resume home Altace  Insomnia: Resume home trazodone  Advanced dementia: Resume home Aricept and Namenda  Sinus bradycardia: Continue monitoring on telemetry  Depression: Resume home Zoloft  GERD: Resume home Protonix   Other information:    Level of Care: Med surge DVT prophylaxis: SCDs Code Status: Full code Consults: Ortho Admission status: Inpatient   Leslee Home DO [facility]   How to contact the [facility] Attending or Consulting provider [place] or covering provider during after hours 7P -7A, for this patient?  Check the care team in [facility] and look for a) attending/consulting TR[facility] provider listed and  b) the [facility] team listed Log into [email or web address] and use [place]'s universal password to access. If you do not have the password, please contact the hospital operator. Locate the [facility] provider you are looking for under [facility] and page to a number that you can be directly reached. If you still have difficulty reaching the provider, please page the [facility] (Director on Call) for the Hospitalists listed on amion for assistance.   07/25/2021, 2:13 PM

## 2021-07-25 NOTE — ED Notes (Signed)
Called floor's charge RN as no nurse yet assigned as room marked clean/ready.

## 2021-07-25 NOTE — ED Notes (Signed)
Pt. Gown and linens changed. Fresh warm blankets provided. Cont. Cardiac monitoring applied. Primo external urinary catheter applied.

## 2021-07-25 NOTE — ED Provider Notes (Signed)
[facility] Emergency Department Provider Note  ____________________________________________   Event Date/Time   First MD Initiated Contact with Patient 07/25/21 [phone]     (approximate)  I have reviewed the triage vital signs and the nursing notes.   HISTORY  Chief Complaint Fall    HPI Marcus Shepherd is a 80 y.o. male presents emergency department via EMS after a fall at home.  Patient has baseline dementia.  EMS states patient has been in severe pain and has been combative on the way to the hospital.  Unable to get IV access to administer pain medication prior to arrival.  His wife is in the room and states that she is unsure if he fell last night when he went outside but did fall again this morning.  He has been unable to stand since he fell.  No head injury.  Is complaining of left hip pain and continues to hold the left hip.  History reviewed. No pertinent past medical history.  Patient Active Problem List   Diagnosis Date Noted   Hip fracture ([place]) 07/25/2021   Elevated PSA 09/07/2019   Sinus bradycardia 06/04/2018   Personal history of other malignant neoplasm of skin 12/11/2017   Osteoarthritis of left knee 01/30/2016   Anemia, unspecified 04/13/2014   Benign essential hypertension 04/13/2014   CAD (coronary artery disease), autologous vein bypass graft 04/13/2014   Neck pain 04/13/2014   Sinoatrial node dysfunction ([place]) 04/13/2014   CAD (coronary artery disease), native coronary artery 12/31/2013   Dementia ([place]) 12/31/2013   HTN (hypertension) 12/31/2013   Hyperlipidemia, unspecified 12/31/2013    Past Surgical History:  Procedure Laterality Date   CARDIAC SURGERY     cardiac stent 2001    Prior to Admission medications   Medication Sig Start Date End Date Taking? Authorizing Provider  aspirin 325 MG EC tablet Take 325 mg by mouth daily. Patient not taking: Reported on 07/25/2021    [provider]  aspirin 81 MG EC  tablet Take 1 tablet by mouth daily. Patient not taking: Reported on 07/25/2021    [provider]  cyanocobalamin 1000 MCG tablet Take by mouth. Patient not taking: Reported on 07/25/2021    [provider]  donepezil (ARICEPT) 10 MG tablet Take 10 mg by mouth at bedtime. Patient not taking: Reported on 07/25/2021    [provider]  memantine (NAMENDA) 10 MG tablet Take 10 mg by mouth 2 (two) times daily. Patient not taking: Reported on 07/25/2021 02/18/20   [provider]  Multiple Vitamin (MULTIVITAMIN WITH MINERALS) TABS tablet Take 1 tablet by mouth daily. Patient not taking: Reported on 07/25/2021    [provider]  omeprazole (PRILOSEC) 40 MG capsule Take 40 mg by mouth daily. Patient not taking: Reported on 07/25/2021    [provider]  pantoprazole (PROTONIX) 40 MG tablet Take 40 mg by mouth daily. Patient not taking: Reported on 07/25/2021 02/18/20   [provider]  potassium chloride (KLOR-CON) 10 MEQ tablet Take 1 tablet by mouth daily. Patient not taking: Reported on 07/25/2021 12/26/20   [provider]  ramipril (ALTACE) 5 MG capsule Take 5 mg by mouth daily. Patient not taking: Reported on 07/25/2021    [provider]  sertraline (ZOLOFT) 25 MG tablet  02/09/20   [provider]  simvastatin (ZOCOR) 80 MG tablet Take 80 mg by mouth at bedtime. Patient not taking: Reported on 07/25/2021    [provider]  traZODone (DESYREL) 50  MG tablet Take by mouth. 09/07/19 09/06/20  [provider]    Allergies Patient has no known allergies.  History reviewed. No pertinent family history.  Social History Social History   Tobacco Use   Smoking status: Former    Types: Cigarettes   Smokeless tobacco: Never  Substance Use Topics   Alcohol use: No   Drug use: No    Review of Systems Patient is poor historian.  Information gathered from family Constitutional: No  fever/chills Eyes: No visual changes. ENT: No sore throat. Respiratory: Denies cough Cardiovascular: Denies chest pain Gastrointestinal: Denies abdominal pain Genitourinary: Negative for dysuria. Musculoskeletal: Negative for back pain.  Positive left hip pain Skin: Negative for rash. Psychiatric: no mood changes, patient has baseline dementia    ____________________________________________   PHYSICAL EXAM:  VITAL SIGNS: ED Triage Vitals [07/25/21 0949]  Enc Vitals Group     BP      Pulse      Resp      Temp      Temp src      SpO2      Weight 154 lb (69.9 kg)     Height      Head Circumference      Peak Flow      Pain Score      Pain Loc      Pain Edu?      Excl. in [place]?     Constitutional: Alert and oriented. Well appearing and in mildly acute distress secondary to pain. Eyes: Conjunctivae are normal.  Head: Atraumatic. Nose: No congestion/rhinnorhea. Mouth/Throat: Mucous membranes are moist.   Neck:  supple no lymphadenopathy noted Cardiovascular: Normal rate, regular rhythm. Heart sounds are normal Respiratory: Normal respiratory effort.  No retractions, lungs c t a  Abd: soft nontender bs normal all 4 quad GU: deferred Musculoskeletal: Patient is holding the left hip and crying in pain.  Range of motion was not performed.  Neurovascular is intact Neurologic:  Normal speech and language.  Skin:  Skin is warm, dry and intact. No rash noted. Psychiatric: Mood and affect are normal. Speech and behavior are normal.  ____________________________________________   LABS (all labs ordered are listed, but only abnormal results are displayed)  Labs Reviewed  COMPREHENSIVE METABOLIC PANEL - Abnormal; Notable for the following components:      Result Value   Glucose, Bld 118 (*)    Calcium 8.5 (*)    Total Protein 5.8 (*)    Albumin 3.1 (*)    All other components within normal limits  CBC WITH DIFFERENTIAL/PLATELET - Abnormal; Notable for the following  components:   RBC 4.05 (*)    Hemoglobin 12.6 (*)    HCT 37.1 (*)    All other components within normal limits  RESP PANEL BY RT-PCR (FLU A&B, COVID) ARPGX2  TYPE AND SCREEN  TROPONIN I (HIGH SENSITIVITY)  TROPONIN I (HIGH SENSITIVITY)   ____________________________________________   ____________________________________________  RADIOLOGY  X-ray of the pelvis and left femur  ____________________________________________   PROCEDURES  Procedure(s) performed: Ativan 2 mg IM, Dilaudid 1 mg IV   Procedures    ____________________________________________   INITIAL IMPRESSION / ASSESSMENT AND PLAN / ED COURSE  Pertinent labs & imaging results that were available during my care of the patient were reviewed by me and considered in my medical decision making (see chart for details).   The patient is an 80 year old male presents after fall.  See HPI.  Physical exam shows patient be stable.  Due to the patient's combativeness along with pain we will give him Ativan 2 mg IM.  Patient was little calmer able to get IV access.  Gave him 1 mg of Dilaudid IV.  X-rays were then ordered.  X-ray staff was notified that he may become combative secondary to pain and is at baseline dementia.  DDx: Left hip fracture, left hip contusion, femur fracture, spinal fracture, fall, dementia  Labs ordered, imaging  X-ray of the pelvis and left femur showed a intertrochanteric fracture Reviewed by me confirmed by radiology  Paged orthopedics, sent secure message to orthopedics its been more than 30 minutes and still not heard back from them.  We will contact hospitalist for admission   Hospital agrees to admission Spoke with orthopedics, will be performing surgery tomorrow.  N.p.o. after midnight.  Would also like CT of the left hip.  Patient is being admitted in stable condition.    WEBER MONNIER was evaluated in Emergency Department on 07/25/2021 for the symptoms described in the history of  present illness. He was evaluated in the context of the global COVID-19 pandemic, which necessitated consideration that the patient might be at risk for infection with the SARS-CoV-2 virus that causes COVID-19. Institutional protocols and algorithms that pertain to the evaluation of patients at risk for COVID-19 are in a state of rapid change based on information released by regulatory bodies including the CDC and federal and state organizations. These policies and algorithms were followed during the patient's care in the ED.    As part of my medical decision making, I reviewed the following data within the [place] History obtained from family, Nursing notes reviewed and incorporated, Labs reviewed , EKG interpreted , Old chart reviewed, Radiograph reviewed , Discussed with admitting physician , A consult was requested and obtained from this/these consultant(s) orthopedics, Notes from prior ED visits, and [place] Controlled Substance Database  ____________________________________________   FINAL CLINICAL IMPRESSION(S) / ED DIAGNOSES  Final diagnoses:  Closed displaced intertrochanteric fracture of left femur, initial encounter ([place])  Fall, initial encounter      NEW MEDICATIONS STARTED DURING THIS VISIT:  New Prescriptions   No medications on file     Note:  This document was prepared using Dragon voice recognition software and may include unintentional dictation errors.    Versie Starks, PA-C 07/25/21 1344    Nena Polio, MD 07/25/21 [phone]

## 2021-07-26 ENCOUNTER — Other Ambulatory Visit: Payer: Self-pay

## 2021-07-26 ENCOUNTER — Inpatient Hospital Stay: Payer: Medicare HMO

## 2021-07-26 ENCOUNTER — Encounter: Payer: Self-pay | Admitting: Family Medicine

## 2021-07-26 ENCOUNTER — Encounter
Admission: EM | Disposition: A | Discharge status: Hospice | Payer: Self-pay | Source: Home / Self Care | Attending: Internal Medicine

## 2021-07-26 ENCOUNTER — Inpatient Hospital Stay: Payer: Medicare HMO | Admitting: Anesthesiology

## 2021-07-26 DIAGNOSIS — E43 Unspecified severe protein-calorie malnutrition: Secondary | ICD-10-CM | POA: Diagnosis present

## 2021-07-26 HISTORY — PX: INTRAMEDULLARY (IM) NAIL INTERTROCHANTERIC: SHX5875

## 2021-07-26 LAB — BASIC METABOLIC PANEL
Anion gap: 7 (ref 5–15)
BUN: 11 mg/dL (ref 8–23)
CO2: 22 mmol/L (ref 22–32)
Calcium: 8.6 mg/dL — ABNORMAL LOW (ref 8.9–10.3)
Chloride: 107 mmol/L (ref 98–111)
Creatinine, Ser: 0.97 mg/dL (ref 0.61–1.24)
GFR, Estimated: >60 mL/min (ref >60)
Glucose, Bld: 106 mg/dL — ABNORMAL HIGH (ref 70–99)
Potassium: 3.7 mmol/L (ref 3.5–5.1)
Sodium: 136 mmol/L (ref 135–145)

## 2021-07-26 LAB — CBC
HCT: 37 % — ABNORMAL LOW (ref 39.0–52.0)
Hemoglobin: 12.6 g/dL — ABNORMAL LOW (ref 13.0–17.0)
MCH: 30 pg (ref 26.0–34.0)
MCHC: 34.1 g/dL (ref 30.0–36.0)
MCV: 88.1 fL (ref 80.0–100.0)
Platelets: 246 10*3/uL (ref 150–400)
RBC: 4.2 MIL/uL — ABNORMAL LOW (ref 4.22–5.81)
RDW: 13.2 % (ref 11.5–15.5)
WBC: 6.7 10*3/uL (ref 4.0–10.5)
nRBC: 0 % (ref 0.0–0.2)

## 2021-07-26 LAB — PROTIME-INR
INR: 1 (ref 0.8–1.2)
Prothrombin Time: 13.5 seconds (ref 11.4–15.2)

## 2021-07-26 LAB — APTT: aPTT: 31 seconds (ref 24–36)

## 2021-07-26 SURGERY — FIXATION, FRACTURE, INTERTROCHANTERIC, WITH INTRAMEDULLARY ROD
Anesthesia: General | Laterality: Left

## 2021-07-26 MED ORDER — ROCURONIUM BROMIDE 10 MG/ML (PF) SYRINGE
PREFILLED_SYRINGE | INTRAVENOUS | Status: AC
  Filled 2021-07-26: qty 10

## 2021-07-26 MED ORDER — DEXAMETHASONE SODIUM PHOSPHATE 10 MG/ML IJ SOLN
INTRAMUSCULAR | Status: DC | PRN
  Administered 2021-07-26: 10 mg via INTRAVENOUS

## 2021-07-26 MED ORDER — BUPIVACAINE HCL (PF) 0.5 % IJ SOLN
INTRAMUSCULAR | Status: AC
  Filled 2021-07-26: qty 10

## 2021-07-26 MED ORDER — EPHEDRINE 5 MG/ML INJ
INTRAVENOUS | Status: AC
  Filled 2021-07-26: qty 5

## 2021-07-26 MED ORDER — SEVOFLURANE IN SOLN
RESPIRATORY_TRACT | Status: AC
  Filled 2021-07-26: qty 250

## 2021-07-26 MED ORDER — DEXAMETHASONE SODIUM PHOSPHATE 10 MG/ML IJ SOLN
INTRAMUSCULAR | Status: AC
  Filled 2021-07-26: qty 1

## 2021-07-26 MED ORDER — ONDANSETRON HCL 4 MG/2ML IJ SOLN
INTRAMUSCULAR | Status: AC
  Filled 2021-07-26: qty 2

## 2021-07-26 MED ORDER — ROCURONIUM 10MG/ML (10ML) SYRINGE FOR MEDFUSION PUMP - OPTIME
INTRAVENOUS | Status: DC | PRN
  Administered 2021-07-26: 20 mg via INTRAVENOUS

## 2021-07-26 MED ORDER — CHLORHEXIDINE GLUCONATE CLOTH 2 % EX PADS
6.0000 | MEDICATED_PAD | Freq: Every day | CUTANEOUS | Status: DC
  Administered 2021-07-27 – 2021-08-01 (×5): 6 via TOPICAL

## 2021-07-26 MED ORDER — CEFAZOLIN SODIUM-DEXTROSE 1-4 GM/50ML-% IV SOLN
INTRAVENOUS | Status: AC
  Filled 2021-07-26: qty 50

## 2021-07-26 MED ORDER — GENTAMICIN SULFATE 40 MG/ML IJ SOLN
INTRAMUSCULAR | Status: AC
  Filled 2021-07-26: qty 2

## 2021-07-26 MED ORDER — 0.9 % SODIUM CHLORIDE (POUR BTL) OPTIME
TOPICAL | Status: DC | PRN
  Administered 2021-07-26: 18:00:00 200 mL

## 2021-07-26 MED ORDER — DEXMEDETOMIDINE (PRECEDEX) IN NS 20 MCG/5ML (4 MCG/ML) IV SYRINGE
PREFILLED_SYRINGE | INTRAVENOUS | Status: DC | PRN
  Administered 2021-07-26 (×2): 6 ug via INTRAVENOUS

## 2021-07-26 MED ORDER — FENTANYL CITRATE (PF) 100 MCG/2ML IJ SOLN
INTRAMUSCULAR | Status: AC
  Filled 2021-07-26: qty 2

## 2021-07-26 MED ORDER — ONDANSETRON HCL 4 MG/2ML IJ SOLN
INTRAMUSCULAR | Status: DC | PRN
  Administered 2021-07-26: 4 mg via INTRAVENOUS

## 2021-07-26 MED ORDER — PROPOFOL 500 MG/50ML IV EMUL
INTRAVENOUS | Status: AC
  Filled 2021-07-26: qty 50

## 2021-07-26 MED ORDER — LIDOCAINE HCL (PF) 2 % IJ SOLN
INTRAMUSCULAR | Status: AC
  Filled 2021-07-26: qty 5

## 2021-07-26 MED ORDER — GLYCOPYRROLATE 0.2 MG/ML IJ SOLN
INTRAMUSCULAR | Status: DC | PRN
  Administered 2021-07-26: .1 mg via INTRAVENOUS

## 2021-07-26 MED ORDER — LIDOCAINE HCL (PF) 1 % IJ SOLN
INTRAMUSCULAR | Status: AC
  Filled 2021-07-26: qty 30

## 2021-07-26 MED ORDER — BUPIVACAINE HCL (PF) 0.5 % IJ SOLN
INTRAMUSCULAR | Status: DC | PRN
  Administered 2021-07-26: 2.5 mL

## 2021-07-26 MED ORDER — FENTANYL CITRATE (PF) 100 MCG/2ML IJ SOLN
INTRAMUSCULAR | Status: DC | PRN
  Administered 2021-07-26: 50 ug via INTRAVENOUS

## 2021-07-26 MED ORDER — LIDOCAINE HCL 1 % IJ SOLN
INTRAMUSCULAR | Status: DC | PRN
  Administered 2021-07-26: 18:00:00 30 mL

## 2021-07-26 MED ORDER — BUPIVACAINE HCL (PF) 0.5 % IJ SOLN
INTRAMUSCULAR | Status: AC
  Filled 2021-07-26: qty 30

## 2021-07-26 MED ORDER — ACETAMINOPHEN 10 MG/ML IV SOLN
INTRAVENOUS | Status: AC
Start: 2021-07-26 — End: ?
  Filled 2021-07-26: qty 100

## 2021-07-26 MED ORDER — EPHEDRINE SULFATE 50 MG/ML IJ SOLN
INTRAMUSCULAR | Status: DC | PRN
  Administered 2021-07-26: 2.5 mg via INTRAVENOUS
  Administered 2021-07-26 (×2): 5 mg via INTRAVENOUS
  Administered 2021-07-26: 2.5 mg via INTRAVENOUS
  Administered 2021-07-26: 5 mg via INTRAVENOUS

## 2021-07-26 MED ORDER — ACETAMINOPHEN 10 MG/ML IV SOLN
INTRAVENOUS | Status: DC | PRN
  Administered 2021-07-26: 1000 mg via INTRAVENOUS

## 2021-07-26 MED ORDER — ENSURE ENLIVE PO LIQD
237.0000 mL | Freq: Three times a day (TID) | ORAL | Status: DC
  Administered 2021-07-27: 10:00:00 237 mL via ORAL

## 2021-07-26 MED ORDER — ENOXAPARIN SODIUM 40 MG/0.4ML IJ SOSY
40.0000 mg | PREFILLED_SYRINGE | INTRAMUSCULAR | Status: DC
Start: 2021-07-27 — End: 2021-07-30
  Administered 2021-07-27 – 2021-07-30 (×3): 40 mg via SUBCUTANEOUS
  Filled 2021-07-26 (×4): qty 0.4

## 2021-07-26 MED ORDER — SUCCINYLCHOLINE CHLORIDE 200 MG/10ML IV SOSY
PREFILLED_SYRINGE | INTRAVENOUS | Status: AC
  Filled 2021-07-26: qty 10

## 2021-07-26 MED ORDER — SUCCINYLCHOLINE 20MG/ML (10ML) SYRINGE FOR MEDFUSION PUMP - OPTIME
INTRAMUSCULAR | Status: DC | PRN
  Administered 2021-07-26: 80 mg via INTRAVENOUS

## 2021-07-26 MED ORDER — BUPIVACAINE-EPINEPHRINE (PF) 0.5% -1:200000 IJ SOLN
INTRAMUSCULAR | Status: AC
  Filled 2021-07-26: qty 30

## 2021-07-26 MED ORDER — CEFAZOLIN SODIUM-DEXTROSE 1-4 GM/50ML-% IV SOLN
1.0000 g | Freq: Three times a day (TID) | INTRAVENOUS | Status: AC
  Administered 2021-07-27 (×3): 1 g via INTRAVENOUS
  Filled 2021-07-26 (×3): qty 50

## 2021-07-26 MED ORDER — PROPOFOL 10 MG/ML IV BOLUS
INTRAVENOUS | Status: DC | PRN
  Administered 2021-07-26: 80 mg via INTRAVENOUS

## 2021-07-26 MED ORDER — VASOPRESSIN 20 UNIT/ML IV SOLN
INTRAVENOUS | Status: DC | PRN
  Administered 2021-07-26 (×2): 2 [IU] via INTRAVENOUS

## 2021-07-26 MED ORDER — PROPOFOL 500 MG/50ML IV EMUL
INTRAVENOUS | Status: DC | PRN
  Administered 2021-07-26: 50 ug/kg/min via INTRAVENOUS
  Administered 2021-07-26: 20 ug/kg/min via INTRAVENOUS

## 2021-07-26 SURGICAL SUPPLY — 46 items
BIT DRILL INTERTAN LAG SCREW (BIT) ×1 IMPLANT
BIT DRILL LONG 4.0 (BIT) IMPLANT
BNDG COHESIVE 6X5 TAN ST LF (GAUZE/BANDAGES/DRESSINGS) ×6 IMPLANT
CHLORAPREP W/TINT 26 (MISCELLANEOUS) ×2 IMPLANT
DRAPE 3/4 80X56 (DRAPES) ×4 IMPLANT
DRAPE C-ARM 42X72 X-RAY (DRAPES) ×2 IMPLANT
DRAPE SURG 17X11 SM STRL (DRAPES) ×4 IMPLANT
DRAPE U-SHAPE 47X51 STRL (DRAPES) ×2 IMPLANT
DRILL BIT LONG 4.0 (BIT) ×2
DRSG OPSITE POSTOP 3X4 (GAUZE/BANDAGES/DRESSINGS) ×3 IMPLANT
ELECT REM PT RETURN 9FT ADLT (ELECTROSURGICAL) ×2
ELECTRODE REM PT RTRN 9FT ADLT (ELECTROSURGICAL) ×1 IMPLANT
GAUZE XEROFORM 1X8 LF (GAUZE/BANDAGES/DRESSINGS) ×1 IMPLANT
GLOVE SURG ENC MOIS LTX SZ7.5 (GLOVE) ×2 IMPLANT
GLOVE SURG UNDER POLY LF SZ7.5 (GLOVE) ×2 IMPLANT
GOWN L4 XLG 20 PK N/S (GOWN DISPOSABLE) ×2 IMPLANT
GOWN STRL REUS W/ TWL LRG LVL3 (GOWN DISPOSABLE) ×1 IMPLANT
GOWN STRL REUS W/TWL LRG LVL3 (GOWN DISPOSABLE) ×1
GUIDE PIN 3.2X343 (PIN) ×2
GUIDE PIN 3.2X343MM (PIN) ×2
HEMOVAC 400CC 10FR (MISCELLANEOUS) IMPLANT
HOLDER FOLEY CATH W/STRAP (MISCELLANEOUS) ×2 IMPLANT
KIT TURNOVER CYSTO (KITS) ×2 IMPLANT
MANIFOLD NEPTUNE II (INSTRUMENTS) ×2 IMPLANT
MAT ABSORB  FLUID 56X50 GRAY (MISCELLANEOUS) ×1
MAT ABSORB FLUID 56X50 GRAY (MISCELLANEOUS) ×1 IMPLANT
NAIL TRIGEN INTERTAN 10X18CM (Nail) ×1 IMPLANT
NS IRRIG 1000ML POUR BTL (IV SOLUTION) ×2 IMPLANT
PACK HIP COMPR (MISCELLANEOUS) ×2 IMPLANT
PAD ARMBOARD 7.5X6 YLW CONV (MISCELLANEOUS) ×2 IMPLANT
PIN GUIDE 3.2X343MM (PIN) IMPLANT
SCREW LAG COMPR KIT 90/85 (Screw) ×1 IMPLANT
SCREW TRIGEN LOW PROF 5.0X32.5 (Screw) ×1 IMPLANT
SPONGE T-LAP 18X18 ~~LOC~~+RFID (SPONGE) ×4 IMPLANT
STAPLER SKIN PROX 35W (STAPLE) ×2 IMPLANT
SUCTION FRAZIER HANDLE 10FR (MISCELLANEOUS) ×1
SUCTION TUBE FRAZIER 10FR DISP (MISCELLANEOUS) ×1 IMPLANT
SUT VIC AB 0 CT1 36 (SUTURE) ×4 IMPLANT
SUT VIC AB 2-0 CT1 (SUTURE) ×2 IMPLANT
SUT VIC AB 2-0 CT1 27 (SUTURE) ×1
SUT VIC AB 2-0 CT1 TAPERPNT 27 (SUTURE) ×1 IMPLANT
SUT VICRYL 0 AB UR-6 (SUTURE) ×2 IMPLANT
SYR 30ML LL (SYRINGE) ×2 IMPLANT
TAPE PAPER 1/2X10 TAN MEDIPORE (MISCELLANEOUS) ×2 IMPLANT
TRAY FOLEY SLVR 16FR LF STAT (SET/KITS/TRAYS/PACK) ×2 IMPLANT
WATER STERILE IRR 500ML POUR (IV SOLUTION) ×2 IMPLANT

## 2021-07-26 NOTE — Anesthesia Preprocedure Evaluation (Addendum)
Anesthesia Evaluation  Patient identified by MRN, date of birth, ID band Patient confused    Reviewed: Allergy & Precautions, NPO status , Patient's Chart, lab work & pertinent test results  History of Anesthesia Complications Negative for: history of anesthetic complications  Airway Mallampati: Unable to assess       Dental  (+) Poor Dentition Missing most teeth:   Pulmonary neg sleep apnea, neg COPD, former smoker,    Pulmonary exam normal breath sounds clear to auscultation       Cardiovascular hypertension, + CAD, + Past MI (20 years ago) and + Cardiac Stents (3 stents 20 years ago)  + dysrhythmias (sinus bradycardia)  Rhythm:Regular Rate:Normal  EKG: sinus rhythm with occasional PVC's Previous EKG showed sinus brady with nonspecific intraventricular conduction delay  HLD   Neuro/Psych PSYCHIATRIC DISORDERS Dementia Advanced dementia    GI/Hepatic negative GI ROS, Neg liver ROS,   Endo/Other  Diabetes: "borderline" diabetes.  Renal/GU negative Renal ROS  negative genitourinary   Musculoskeletal  (+) Arthritis ,   Abdominal   Peds  Hematology  (+) Blood dyscrasia, anemia ,   Anesthesia Other Findings   Reproductive/Obstetrics                          Anesthesia Physical Anesthesia Plan  ASA: 3  Anesthesia Plan: Spinal   Post-op Pain Management:    Induction:   PONV Risk Score and Plan: 1 and Ondansetron, Propofol infusion, TIVA and Treatment may vary due to age or medical condition  Airway Management Planned: Natural Airway  Additional Equipment:   Intra-op Plan:   Post-operative Plan:   Informed Consent: I have reviewed the patients History and Physical, chart, labs and discussed the procedure including the risks, benefits and alternatives for the proposed anesthesia with the patient or authorized representative who has indicated his/her understanding and acceptance.    Patient has DNR.  Discussed DNR with power of attorney.   Consent reviewed with POA  Plan Discussed with: Anesthesiologist and CRNA  Anesthesia Plan Comments: (Phone consent obtained from pt's wife Joevon Holliman at 1345 on 07/26/21.  Discussed risks/benefits of spinal vs GETA, and pt's wife provided consent for both. Will plan for spinal and IV propofol infusion with native airway, GETA backup. K Ramsdell  In addition, saw pt's wife at bedside in preop.  Again reviewed plan for spinal with GETA backup, discussing risks.  Pt's wife noted that pt has DNR although not noted this admission.  She agrees to all resuscitative measures EXCEPT chest compressions, and I agree to uphold these wishes. A Matsue Strom)     Anesthesia Quick Evaluation

## 2021-07-26 NOTE — Progress Notes (Signed)
Initial Nutrition Assessment  DOCUMENTATION CODES:  Severe malnutrition in context of chronic illness  INTERVENTION:  Advance diet as medically able and as tolerated.   Add Ensure Enlive po TID, each supplement provides 350 kcal and 20 grams of protein.  Continue MVI with minerals daily.  Recommend feeding assistance with meals.  Encourage PO and supplement intake.  NUTRITION DIAGNOSIS:  Severe Malnutrition related to chronic illness (dementia) as evidenced by severe fat depletion, severe muscle depletion.  GOAL:  Patient will meet greater than or equal to 90% of their needs  MONITOR:  Diet advancement, PO intake, Supplement acceptance, Labs, Weight trends, Skin, I & O's  REASON FOR ASSESSMENT:  Malnutrition Screening Tool    ASSESSMENT:  80 yo male with a PMH of CAD, HTN, sinus bradycardia, HLD, advanced dementia, and GERD who presents with a L hip fracture after a fall.  Attempted to speak with pt at bedside. Pt did not awaken to touch or voice.  No family present at bedside and pt with advanced dementia. Unable to get any nutrition related history.  Pt's weight has remained consistent over the past year per Epic. However, weight history is limited.  Given patient's advanced dementia, pt may benefit from feeding assistance with meals. RD to order.  Medications: reviewed; MVI with minerals, Protonix, Klor-Con 10 mEq, Vitamin B12, LR @ 50 ml/hr, Ativan PRN (given once today)  Labs: reviewed; Glucose 106 (H)  NUTRITION - FOCUSED PHYSICAL EXAM: Flowsheet Row Most Recent Value  Orbital Region Severe depletion  Upper Arm Region Moderate depletion  Thoracic and Lumbar Region Mild depletion  Buccal Region Severe depletion  Temple Region Severe depletion  Clavicle Bone Region Moderate depletion  Clavicle and Acromion Bone Region Severe depletion  Scapular Bone Region Unable to assess  Dorsal Hand Unable to assess  [[place]]  Patellar Region Moderate depletion  Anterior  Thigh Region Moderate depletion  Posterior Calf Region Moderate depletion  Edema (RD Assessment) None  Hair Reviewed  Eyes Unable to assess  Mouth Unable to assess  Skin Reviewed  Nails Unable to assess   Diet Order:   Diet Order             Diet NPO time specified Except for: Sips with Meds  Diet effective now                  EDUCATION NEEDS:  Education needs have been addressed  Skin:  Skin Assessment: Reviewed RN Assessment  Last BM:  unknown  Height:  Ht Readings from Last 1 Encounters:  05/14/21 5\' 10"  (1.778 m)   Weight:  Wt Readings from Last 1 Encounters:  07/25/21 69.9 kg   BMI:  Body mass index is 22.1 kg/m.  Estimated Nutritional Needs:  Kcal:  1900-2100 Protein:  85-100 grams Fluid:  >1.9 L  Derrel Nip, RD, LDN (she/her/hers) Clinical Inpatient Dietitian RD Pager/After-Hours/Weekend Pager # in [place]

## 2021-07-26 NOTE — Discharge Instructions (Signed)
Nutrition Post Hospital Stay Proper nutrition can help your body recover from illness and injury.   Foods and beverages high in protein, vitamins, and minerals help rebuild muscle loss, promote healing, & reduce fall risk.   .In addition to eating healthy foods, a nutrition shake is an easy, delicious way to get the nutrition you need during and after your hospital stay  It is recommended that you continue to drink 2 bottles per day of:       Ensure for at least 1 month (30 days) after your hospital stay   Tips for adding a nutrition shake into your routine: As allowed, drink one with vitamins or medications instead of water or juice Enjoy one as a tasty mid-morning or afternoon snack Drink cold or make a milkshake out of it Drink one instead of milk with cereal or snacks Use as a coffee creamer   Available at the following grocery stores and pharmacies:           * [employer] * [employer] * [employer]  * R[employer] Aid          * Walmart * [employer]  * Wal[facility]reens      * Target  * [employer]J's   * [facility]  * [employer]   * [facility] [phone]            For COUPONS visit: www.[email or web address]/join or [email or web address]/sign-[email or web address]   Suggested Substitutions Ensure Plus = Boost Plus = Carnation Breakfast Essentials = Boost Compact Ensure Active Clear = Boost Breeze Glucerna Shake = Boost Glucose Control = Carnation Breakfast Essentials SUGAR FREE       

## 2021-07-26 NOTE — Progress Notes (Signed)
OT Cancellation Note  Patient Details Name: Marcus Shepherd MRN: [id number] DOB: 1940/11/03   Cancelled Treatment:    Reason Eval/Treat Not Completed: Medical issues which prohibited therapy;Other (comment). OT order received. Chart reviewed. Per nursing/ortho notes pt is to have sx today. OT will complete order at this time. Will require new OT orders after sx.   Shara Blazing, M.S., OTR/L [facility] - [place] [facility] Ascom: [phone] 07/26/21, 10:23 AM

## 2021-07-26 NOTE — Progress Notes (Signed)
PT Cancellation Note  Patient Details Name: Marcus Shepherd MRN: [id number] DOB: 01-21-1941   Cancelled Treatment:    Reason Eval/Treat Not Completed: Medical issues which prohibited therapy.  PT order received. Chart reviewed. Per nursing/ortho notes pt is to have sx today. PT will complete order at this time. Will require new PT orders after sx.    Gwenlyn Saran, PT, DPT 07/26/21, 11:12 AM

## 2021-07-26 NOTE — Progress Notes (Signed)
Pt placed on mittens at hs due to pulling off male wick, telemetry box and IV tubing; mittens were effective.

## 2021-07-26 NOTE — Op Note (Signed)
DATE OF SURGERY:  07/26/2021  TIME: 6:21 PM  PATIENT NAME:  Marcus Shepherd  AGE: 80 y.o.  PRE-OPERATIVE DIAGNOSIS:  Left Intertrochanteric Hip Fracture  POST-OPERATIVE DIAGNOSIS:  SAME  PROCEDURE:  Left INTRAMEDULLARY (IM) NAIL INTERTROCHANTRIC  SURGEON:  Renee Harder  EBL:  559 cc  COMPLICATIONS:  none  OPERATIVE IMPLANTS: [employer] Intertan femoral nail 10 mm x 132mm  PREOPERATIVE INDICATIONS:  Marcus Shepherd is a 80 y.o. year old who fell and suffered a hip fracture. He was brought into the ER and then admitted and optimized and then elected for surgical intervention.    The risks benefits and alternatives were discussed with the patient including but not limited to the risks of nonoperative treatment, versus surgical intervention including infection, bleeding, nerve injury, malunion, nonunion, hardware prominence, hardware failure, need for hardware removal, blood clots, cardiopulmonary complications, morbidity, mortality, among others, and they were willing to proceed.    OPERATIVE PROCEDURE:  The patient was brought to the operating room and placed in the supine position.  Spinal anesthesia was administered, with a foley.  This was converted to general anesthesia during the procedure.  He was placed on the fracture table.  Closed reduction was performed under C-arm guidance. The length of the femur was also measured using fluoroscopy. Time out was then performed after sterile prep and drape. He received preoperative antibiotics.  Incision was made proximal to the greater trochanter. A guidewire was placed in the appropriate position. Confirmation was made on AP and lateral views. The above-named nail was opened. I opened the proximal femur with a reamer. I then placed the nail by hand easily down. I did not need to ream the femur.  Once the nail was completely seated, I placed a guidepin into the femoral head into the center center position through a second  incision.  The anterior femoral neck was displacing anteriorly and a Cobb elevator was used to put pressure posteriorly to help hold it reduced during guidepin placement. There was comminution which made reduction more difficult.  I measured the length, and then reamed the lateral cortex and up into the head. I then placed the two interlocking lag screws. Slight compression was applied.  There was anterior translation of the femoral head/neck on the distal femoral neck/femur shaft fracture.  While femoral neck fixation was not ideal, solid fixation was achieved within the center center of the femoral head, holding the fracture securely.  Bone quality was poor.  I then secured the proximal interlocking screws.  I then removed the instruments, and took final C-arm pictures AP and lateral the entire length of the leg.  Appropriate alignment was achieved, and the wounds were irrigated copiously and closed with Vicryl  followed by staples and dry sterile dressing. Sponge and needle count were correct.   The patient was awakened and returned to PACU in stable and satisfactory condition. There no complications and the patient tolerated the procedure well.   POSTOPERATIVE PLAN: PT/OT: Patient will be partial weightbearing on the left lower extremity given reduction, fixation and bone quality. Ok to start DVT ppx POD#1 -Lovenox x14 days Dressing change by nursing staff as needed to keep dressing clean and dry Outpatient f/u in clinic in 2 weeks for staple removal and xrays  Renee Harder

## 2021-07-26 NOTE — Progress Notes (Signed)
PROGRESS NOTE   Marcus Shepherd  WER:[id number] DOB: 07/12/1941 DOA: 07/25/2021 PCP: Tracie Harrier, MD  Brief Narrative:  80 year old white male Primary dysphagia stricture status post dilatations at the [place] in the past--diverticulosis on last colonoscopy 2010 CAD with stent follows with Dr. Saralyn Pilar Progressive dementia on meds Elevated PSA declines further evaluation  Found facedown complaining of left hip pain on 12/20-was agitated on initial eval X-ray of pelvis showed left femoral intertrochanteric fracture 1112 White count 6 BUN/creatinine 11/0.9 sodium 136  Orthopedics Dr. Sharlet Salina was consulted    Hospital-Problem based course  L Intertroch # Defer to Dr. Sharlet Salina --seems like surgery later today Wb, Therapy recs and pain control as per Ortho CAD with prior Cardiac stent Cont Ramipril  5, Lipitor 40 Esophageal strictures in the past, prior diverticulosis Clinically quiescent at this time Continue Protonix 40 daily Alzheimer's dementia Behavioral disturbances Continue Zoloft 25 daily, Namenda 10 twice daily, Aricept 10 at bedtime, trazodone 50 at bedtime- For as needed's if severe anxiety or agitation Ativan 0.5 every 6 as needed, Haldol 2 mg every 6 as needed IM  DVT prophylaxis: SCD Code Status: Full Family Communication: Discussed with patient's wife at the bedside Disposition:  Status is: Inpatient  Remains inpatient appropriate because: Hip fracture and need for corrective surgery       Consultants:  Orthopedics  Procedures: None  Antimicrobials:   Subjective: Events overnight noted some agitated mellitus, Laying in bed Wife answers questions He is arousable but I chose not to disturb him as he had just received something for agitation  Objective: Vitals:   07/25/21 1317 07/25/21 1438 07/26/21 0545 07/26/21 0735  BP:  134/65 129/60 (!) 150/45  Pulse:  (!) 52 (!) 52 61  Resp:  19 15 20   Temp: 97.9 F (36.6 C) 98.8 F (37.1 C) 98.5  F (36.9 C) 98.7 F (37.1 C)  TempSrc: Axillary Axillary    SpO2:  100%  99%  Weight:        Intake/Output Summary (Last 24 hours) at 07/26/2021 1151 Last data filed at 07/26/2021 0500 Gross per 24 hour  Intake 632.25 ml  Output --  Net 632.25 ml    Filed Weights   07/25/21 0949  Weight: 69.9 kg    Examination: Awake coherent no distress EOMI NCAT no focal deficit CTA B no added sound no rales rhonchi S1-S2 no murmur Rest of General exam deferred at this time   Data Reviewed: personally reviewed   CBC    Component Value Date/Time   WBC 6.7 07/26/2021 0348   RBC 4.20 (L) 07/26/2021 0348   HGB 12.6 (L) 07/26/2021 0348   HCT 37.0 (L) 07/26/2021 0348   PLT 246 07/26/2021 0348   MCV 88.1 07/26/2021 0348   MCH 30.0 07/26/2021 0348   MCHC 34.1 07/26/2021 0348   RDW 13.2 07/26/2021 0348   LYMPHSABS 0.9 07/25/2021 1133   MONOABS 0.5 07/25/2021 1133   EOSABS 0.2 07/25/2021 1133   BASOSABS 0.0 07/25/2021 1133   CMP Latest Ref Rng & Units 07/26/2021 07/25/2021 05/14/2021  Glucose 70 - 99 mg/dL 106(H) 118(H) 111(H)  BUN 8 - 23 mg/dL 11 9 11   Creatinine 0.61 - 1.24 mg/dL 0.97 1.07 1.02  Sodium 135 - 145 mmol/L 136 136 139  Potassium 3.5 - 5.1 mmol/L 3.7 3.6 3.7  Chloride 98 - 111 mmol/L 107 106 105  CO2 22 - 32 mmol/L 22 25 28   Calcium 8.9 - 10.3 mg/dL 8.6(L) 8.5(L) 8.8(L)  Total Protein 6.5 -  8.1 g/dL - 5.8(L) -  Total Bilirubin 0.3 - 1.2 mg/dL - 0.8 -  Alkaline Phos 38 - 126 U/L - 55 -  AST 15 - 41 U/L - 19 -  ALT 0 - 44 U/L - 7 -     Radiology Studies: DG Pelvis 1-2 Views  Result Date: 07/25/2021 CLINICAL DATA:  Fall, hip pain EXAM: PELVIS - 1-2 VIEW COMPARISON:  None. FINDINGS: Acute intertrochanteric fracture of the left femur partially visualized. No acute fracture identified in the pelvis. Bones are osteopenic. IMPRESSION: Acute intertrochanteric fracture of the left femur. Electronically Signed   By: Ofilia Neas M.D.   On: 07/25/2021 11:13   CT Hip  Left Wo Contrast  Result Date: 07/25/2021 CLINICAL DATA:  Fall at home, hip fracture EXAM: CT OF THE LEFT HIP WITHOUT CONTRAST TECHNIQUE: Multidetector CT imaging of the left hip was performed according to the standard protocol. Multiplanar CT image reconstructions were also generated. COMPARISON:  Radiographs performed earlier on the same date. FINDINGS: Bones/Joint/Cartilage There is displaced intertrochanteric fracture of the left femur with superolateral displacement of the distal femur. There is approximately 11 mm lateral displacement at the lesser trochanter. The femoral head is located in the acetabulum. No other appreciable fracture. Ligaments Suboptimally assessed by CT. Muscles and Tendons Muscles are normal in bulk and density. No intramuscular hematoma/seroma. Soft tissues Atherosclerotic calcification of the femoral vessels. Subcutaneous fascial stranding about the lateral aspect of the hip. No drainable fluid collection or hematoma/seroma. IMPRESSION: Displaced intertrochanteric fracture of the left femur with superolateral displacement of the distal femur. No other fracture or dislocation. No significant soft tissue abnormality. Electronically Signed   By: Keane Police D.O.   On: 07/25/2021 14:35   DG Femur Min 2 Views Left  Result Date: 07/25/2021 CLINICAL DATA:  Fall, hip pain EXAM: LEFT FEMUR 2 VIEWS COMPARISON:  None. FINDINGS: Acute intertrochanteric fracture of the left femur with up to 12 mm displacement at the lesser trochanter. No fracture identified in the more distal femur. Bones are osteopenic. Arterial vascular calcifications noted. IMPRESSION: Acute intertrochanteric fracture of the left femur. Electronically Signed   By: Ofilia Neas M.D.   On: 07/25/2021 11:13     Scheduled Meds:  aspirin EC  81 mg Oral Daily   atorvastatin  40 mg Oral QPM   donepezil  10 mg Oral QHS   memantine  10 mg Oral BID   multivitamin with minerals  1 tablet Oral Daily   pantoprazole  40  mg Oral Daily   potassium chloride  10 mEq Oral Daily   ramipril  5 mg Oral Daily   sertraline  25 mg Oral Daily   traZODone  50 mg Oral QHS   cyanocobalamin  1,000 mcg Oral Daily   Continuous Infusions:   ceFAZolin (ANCEF) IV     lactated ringers 50 mL/hr at 07/25/21 1620     LOS: 1 day   Time spent: Thornton, MD [facility] To contact the attending provider between 7A-7P or the covering provider during after hours 7P-7A, please log into the web site [email or web address] and access using universal [place] password for that web site. If you do not have the password, please call the hospital operator.  07/26/2021, 11:51 AM

## 2021-07-26 NOTE — Consult Note (Signed)
ORTHOPAEDIC CONSULTATION  REQUESTING PHYSICIAN: Nita Sells, MD  Chief Complaint: Left hip pain  HPI: Marcus Shepherd is a 80 y.o. male who complains of left hip pain after mechanical fall. The pain is sharp in character. The pain is worse with movement and better with rest.  Patient lives at home with his wife.  Patient has dementia and history is obtained from his wife.  He is not on any anticoagulation does not use any assistive ambulatory device.  Past medical history of coronary artery artery disease, hypertension, sinus bradycardia, hyperlipidemia, advanced dementia, GERD  History reviewed. No pertinent past medical history. Past Surgical History:  Procedure Laterality Date   CARDIAC SURGERY     cardiac stent 2001   Social History   Socioeconomic History   Marital status: Married    Spouse name: Not on file   Number of children: Not on file   Years of education: Not on file   Highest education level: Not on file  Occupational History   Not on file  Tobacco Use   Smoking status: Former    Types: Cigarettes   Smokeless tobacco: Never  Substance and Sexual Activity   Alcohol use: No   Drug use: No   Sexual activity: Not on file  Other Topics Concern   Not on file  Social History Narrative   Not on file   Social Determinants of Health   Financial Resource Strain: Not on file  Food Insecurity: Not on file  Transportation Needs: Not on file  Physical Activity: Not on file  Stress: Not on file  Social Connections: Not on file   History reviewed. No pertinent family history. No Known Allergies Prior to Admission medications   Medication Sig Start Date End Date Taking? Authorizing Provider  aspirin 81 MG EC tablet Take 1 tablet by mouth daily. Patient not taking: Reported on 07/25/2021    [provider]  cyanocobalamin 1000 MCG tablet Take by mouth. Patient not taking: Reported on 07/25/2021    [provider]  donepezil  (ARICEPT) 10 MG tablet Take 10 mg by mouth at bedtime. Patient not taking: Reported on 07/25/2021    [provider]  memantine (NAMENDA) 10 MG tablet Take 10 mg by mouth 2 (two) times daily. Patient not taking: Reported on 07/25/2021 02/18/20   [provider]  Multiple Vitamin (MULTIVITAMIN WITH MINERALS) TABS tablet Take 1 tablet by mouth daily. Patient not taking: Reported on 07/25/2021    [provider]  pantoprazole (PROTONIX) 40 MG tablet Take 40 mg by mouth daily. Patient not taking: Reported on 07/25/2021 02/18/20   [provider]  potassium chloride (KLOR-CON) 10 MEQ tablet Take 1 tablet by mouth daily. Patient not taking: Reported on 07/25/2021 12/26/20   [provider]  ramipril (ALTACE) 5 MG capsule Take 5 mg by mouth daily. Patient not taking: Reported on 07/25/2021    [provider]  sertraline (ZOLOFT) 25 MG tablet  02/09/20   [provider]  simvastatin (ZOCOR) 80 MG tablet Take 80 mg by mouth at bedtime. Patient not taking: Reported on 07/25/2021    [provider]  traZODone (DESYREL) 50 MG tablet Take by mouth. 09/07/19 09/06/20  [provider]   DG Pelvis 1-2 Views  Result Date: 07/25/2021 CLINICAL DATA:  Fall, hip pain EXAM: PELVIS - 1-2 VIEW COMPARISON:  None. FINDINGS: Acute intertrochanteric fracture of the left femur partially visualized. No acute fracture identified in the pelvis. Bones are osteopenic. IMPRESSION: Acute intertrochanteric  fracture of the left femur. Electronically Signed   By: Ofilia Neas M.D.   On: 07/25/2021 11:13   CT Hip Left Wo Contrast  Result Date: 07/25/2021 CLINICAL DATA:  Fall at home, hip fracture EXAM: CT OF THE LEFT HIP WITHOUT CONTRAST TECHNIQUE: Multidetector CT imaging of the left hip was performed according to the standard protocol. Multiplanar CT image reconstructions were also generated. COMPARISON:  Radiographs performed earlier on the same date.  FINDINGS: Bones/Joint/Cartilage There is displaced intertrochanteric fracture of the left femur with superolateral displacement of the distal femur. There is approximately 11 mm lateral displacement at the lesser trochanter. The femoral head is located in the acetabulum. No other appreciable fracture. Ligaments Suboptimally assessed by CT. Muscles and Tendons Muscles are normal in bulk and density. No intramuscular hematoma/seroma. Soft tissues Atherosclerotic calcification of the femoral vessels. Subcutaneous fascial stranding about the lateral aspect of the hip. No drainable fluid collection or hematoma/seroma. IMPRESSION: Displaced intertrochanteric fracture of the left femur with superolateral displacement of the distal femur. No other fracture or dislocation. No significant soft tissue abnormality. Electronically Signed   By: Keane Police D.O.   On: 07/25/2021 14:35   DG Femur Min 2 Views Left  Result Date: 07/25/2021 CLINICAL DATA:  Fall, hip pain EXAM: LEFT FEMUR 2 VIEWS COMPARISON:  None. FINDINGS: Acute intertrochanteric fracture of the left femur with up to 12 mm displacement at the lesser trochanter. No fracture identified in the more distal femur. Bones are osteopenic. Arterial vascular calcifications noted. IMPRESSION: Acute intertrochanteric fracture of the left femur. Electronically Signed   By: Ofilia Neas M.D.   On: 07/25/2021 11:13    Positive ROS: All other systems have been reviewed and were otherwise negative with the exception of those mentioned in the HPI and as above.  Physical Exam: General: Alert, unable to answer questions secondary to dementia Cardiovascular: No pedal edema Respiratory: No cyanosis, no use of accessory musculature GI: No organomegaly, abdomen is soft and non-tender Skin: No lesions in the area of chief complaint Neurologic: Sensation intact distally Lymphatic: No axillary or cervical lymphadenopathy  MUSCULOSKELETAL: Tenderness palpation about the  left hip, patient appears nontender to the knee and ankle, grossly motor and sensory neurovascular intact  Assessment: 80 year old male with advanced dementia admitted with a displaced left intertrochanteric hip fracture  Plan: -Appreciate hospitalist team support -Long discussion was had with the patient's wife regarding operative and nonoperative treatment options.  We discussed risks of surgery and expected postoperative outcomes.  Following discussion, patient's wife expressed interest in proceeding with surgery.  Recommendation was made for left hip intramedullary nail.  We will plan for surgery this afternoon.  Please keep NPO.    Renee Harder, MD    07/26/2021 7:55 AM

## 2021-07-26 NOTE — Anesthesia Procedure Notes (Signed)
Spinal  Patient location during procedure: OR Start time: 07/26/2021 3:39 PM End time: 07/26/2021 3:49 PM Reason for block: surgical anesthesia Staffing Performed: resident/CRNA  Anesthesiologist: Darrin Nipper, MD Resident/CRNA: Johnna Acosta, CRNA Preanesthetic Checklist Completed: patient identified, IV checked, site marked, risks and benefits discussed, surgical consent, monitors and equipment checked, pre-op evaluation and timeout performed Spinal Block Patient position: left lateral decubitus Prep: ChloraPrep Patient monitoring: heart rate, continuous pulse ox, blood pressure and cardiac monitor Approach: midline Location: L3-4 Injection technique: single-shot Needle Needle type: Whitacre and Introducer  Needle gauge: 24 G Needle length: 9 cm Assessment Sensory level: T10 Events: CSF return Additional Notes Negative paresthesia. Negative blood return. Positive free-flowing CSF. Expiration date of kit checked and confirmed. Patient tolerated procedure well, without complications.

## 2021-07-26 NOTE — Anesthesia Procedure Notes (Addendum)
Procedure Name: Intubation Date/Time: 07/26/2021 4:58 PM Performed by: Johnna Acosta, CRNA Pre-anesthesia Checklist: Patient identified, Emergency Drugs available, Suction available and Patient being monitored Patient Re-evaluated:Patient Re-evaluated prior to induction Oxygen Delivery Method: Circle system utilized Preoxygenation: Pre-oxygenation with 100% oxygen Induction Type: IV induction and Rapid sequence Laryngoscope Size: McGraph and 3 Grade View: Grade II Tube type: Oral Tube size: 7.0 mm Number of attempts: 1 Airway Equipment and Method: Stylet and Video-laryngoscopy Placement Confirmation: ETT inserted through vocal cords under direct vision, positive ETCO2 and breath sounds checked- equal and bilateral Secured at: 20 cm Tube secured with: Tape Dental Injury: Teeth and Oropharynx as per pre-operative assessment

## 2021-07-26 NOTE — Anesthesia Procedure Notes (Addendum)
Date/Time: 07/26/2021 3:30 PM Performed by: Johnna Acosta, CRNA Pre-anesthesia Checklist: Patient identified, Emergency Drugs available, Suction available and Patient being monitored Patient Re-evaluated:Patient Re-evaluated prior to induction Oxygen Delivery Method: Simple face mask Preoxygenation: Pre-oxygenation with 100% oxygen

## 2021-07-26 NOTE — Transfer of Care (Signed)
Immediate Anesthesia Transfer of Care Note  Patient: Marcus Shepherd  Procedure(s) Performed: INTRAMEDULLARY (IM) NAIL INTERTROCHANTRIC (Left)  Patient Location: PACU  Anesthesia Type:General  Level of Consciousness: sedated  Airway & Oxygen Therapy: Patient Spontanous Breathing and Patient connected to nasal cannula oxygen  Post-op Assessment: Report given to RN and Post -op Vital signs reviewed and stable  Post vital signs: Reviewed and stable  Last Vitals:  Vitals Value Taken Time  BP 130/54 07/26/21 1815  Temp    Pulse 51 07/26/21 1818  Resp 23 07/26/21 1818  SpO2 100 % 07/26/21 1818  Vitals shown include unvalidated device data.  Last Pain:  Vitals:   07/26/21 1455  TempSrc: Temporal  PainSc:          Complications: No notable events documented.

## 2021-07-27 ENCOUNTER — Encounter: Payer: Self-pay | Admitting: Orthopaedic Surgery

## 2021-07-27 LAB — CBC WITH DIFFERENTIAL/PLATELET
Abs Immature Granulocytes: 0.02 10*3/uL (ref 0.00–0.07)
Basophils Absolute: 0 10*3/uL (ref 0.0–0.1)
Basophils Relative: 0 %
Eosinophils Absolute: 0 10*3/uL (ref 0.0–0.5)
Eosinophils Relative: 0 %
HCT: 33.1 % — ABNORMAL LOW (ref 39.0–52.0)
Hemoglobin: 11.1 g/dL — ABNORMAL LOW (ref 13.0–17.0)
Immature Granulocytes: 0 %
Lymphocytes Relative: 8 %
Lymphs Abs: 0.5 10*3/uL — ABNORMAL LOW (ref 0.7–4.0)
MCH: 30.4 pg (ref 26.0–34.0)
MCHC: 33.5 g/dL (ref 30.0–36.0)
MCV: 90.7 fL (ref 80.0–100.0)
Monocytes Absolute: 0.3 10*3/uL (ref 0.1–1.0)
Monocytes Relative: 5 %
Neutro Abs: 5.7 10*3/uL (ref 1.7–7.7)
Neutrophils Relative %: 87 %
Platelets: 198 10*3/uL (ref 150–400)
RBC: 3.65 MIL/uL — ABNORMAL LOW (ref 4.22–5.81)
RDW: 13.2 % (ref 11.5–15.5)
WBC: 6.5 10*3/uL (ref 4.0–10.5)
nRBC: 0 % (ref 0.0–0.2)

## 2021-07-27 LAB — BASIC METABOLIC PANEL
Anion gap: 6 (ref 5–15)
BUN: 13 mg/dL (ref 8–23)
CO2: 23 mmol/L (ref 22–32)
Calcium: 8.2 mg/dL — ABNORMAL LOW (ref 8.9–10.3)
Chloride: 105 mmol/L (ref 98–111)
Creatinine, Ser: 0.76 mg/dL (ref 0.61–1.24)
GFR, Estimated: >60 mL/min (ref >60)
Glucose, Bld: 161 mg/dL — ABNORMAL HIGH (ref 70–99)
Potassium: 4 mmol/L (ref 3.5–5.1)
Sodium: 134 mmol/L — ABNORMAL LOW (ref 135–145)

## 2021-07-27 MED ORDER — LORAZEPAM 2 MG/ML IJ SOLN
0.5000 mg | Freq: Three times a day (TID) | INTRAMUSCULAR | Status: DC | PRN
  Administered 2021-07-27 – 2021-07-28 (×2): 0.5 mg via INTRAVENOUS
  Filled 2021-07-27 (×2): qty 1

## 2021-07-27 NOTE — Progress Notes (Addendum)
PROGRESS NOTE   Marcus Shepherd  CXK:[id number] DOB: 28-Mar-1941 DOA: 07/25/2021 PCP: Tracie Harrier, MD  Brief Narrative:  80 year old white male Primary dysphagia stricture status post dilatations at the [place] in the past--diverticulosis on last colonoscopy 2010 CAD with stent follows with Dr. Saralyn Pilar Progressive dementia on meds Elevated PSA declines further evaluation  Found facedown complaining of left hip pain on 12/20-was agitated on initial eval X-ray of pelvis showed left femoral intertrochanteric fracture 1112 White count 6 BUN/creatinine 11/0.9 sodium 136  Orthopedics Dr. Sharlet Salina was consulted    Hospital-Problem based course  L Intertroch # s/p IM nail 12/21 Dr. Sharlet Salina  as per Ortho, Partial weightbearing   Lovenox X 14 days DVT prophylaxis  dry dressings  Fentanyl IV discontinued  Continue Norco every 4 as needed pain control CAD with prior Cardiac stent Cont Ramipril  5, Lipitor 40 Esophageal strictures in the past, prior diverticulosis Clinically quiescent at this time Continue Protonix 40 daily Alzheimer's dementia Behavioral disturbances Continue Zoloft 25 daily, Namenda 10 twice daily, Aricept 10 at bedtime, trazodone 50 at bedtime-  if severe anxiety or agitation Ativan 0.5 every 6--->q8prn Haldol discontinued at this time  continue saline today as not clear if he will eat-saline lock in a.m.   DVT prophylaxis: SCD Code Status: Full Family Communication: Discussed with patient's wife at the bedside Disposition:  Status is: Inpatient  Remains inpatient appropriate because: Hip fracture and need for corrective surgery       Consultants:  Orthopedics  Procedures: None  Antimicrobials:   Subjective:  Incoherent and ROS unobtainable Patient is trying to take off medications on the right side foley cath removed by the patient-he does not appear to be in distress, no bleed  Some concerns of him being feverish  Objective: Vitals:    07/26/21 2115 07/26/21 2221 07/27/21 0510 07/27/21 0750  BP: (!) 118/59 115/65 (!) 104/59 108/63  Pulse: (!) 50 (!) 59 (!) 52 83  Resp: 20 18 20 20   Temp: 97.7 F (36.5 C) 97.7 F (36.5 C) 98.9 F (37.2 C) 98.3 F (36.8 C)  TempSrc:  Temporal  Axillary  SpO2: 94% 100% 100% 100%  Weight:      Height:        Intake/Output Summary (Last 24 hours) at 07/27/2021 1019 Last data filed at 07/27/2021 0647 Gross per 24 hour  Intake 1724.78 ml  Output 2275 ml  Net -550.22 ml    Filed Weights   07/25/21 0949 07/26/21 1455  Weight: 69.9 kg 69.9 kg    Examination: Awake incoherent and confused  no distress EOMI NCAT no focal deficit CTA B no added sound no rales rhonchi S1-S2 no murmur Wearing mittens Condom cath not connected Abdomen soft no rebound Neurological exam not obtained   Data Reviewed: personally reviewed   CBC    Component Value Date/Time   WBC 6.5 07/27/2021 0255   RBC 3.65 (L) 07/27/2021 0255   HGB 11.1 (L) 07/27/2021 0255   HCT 33.1 (L) 07/27/2021 0255   PLT 198 07/27/2021 0255   MCV 90.7 07/27/2021 0255   MCH 30.4 07/27/2021 0255   MCHC 33.5 07/27/2021 0255   RDW 13.2 07/27/2021 0255   LYMPHSABS 0.5 (L) 07/27/2021 0255   MONOABS 0.3 07/27/2021 0255   EOSABS 0.0 07/27/2021 0255   BASOSABS 0.0 07/27/2021 0255   CMP Latest Ref Rng & Units 07/27/2021 07/26/2021 07/25/2021  Glucose 70 - 99 mg/dL 161(H) 106(H) 118(H)  BUN 8 - 23 mg/dL 13 11 9  Creatinine 0.61 - 1.24 mg/dL 0.76 0.97 1.07  Sodium 135 - 145 mmol/L 134(L) 136 136  Potassium 3.5 - 5.1 mmol/L 4.0 3.7 3.6  Chloride 98 - 111 mmol/L 105 107 106  CO2 22 - 32 mmol/L 23 22 25   Calcium 8.9 - 10.3 mg/dL 8.2(L) 8.6(L) 8.5(L)  Total Protein 6.5 - 8.1 g/dL - - 5.8(L)  Total Bilirubin 0.3 - 1.2 mg/dL - - 0.8  Alkaline Phos 38 - 126 U/L - - 55  AST 15 - 41 U/L - - 19  ALT 0 - 44 U/L - - 7     Radiology Studies: DG Pelvis 1-2 Views  Result Date: 07/25/2021 CLINICAL DATA:  Fall, hip pain EXAM:  PELVIS - 1-2 VIEW COMPARISON:  None. FINDINGS: Acute intertrochanteric fracture of the left femur partially visualized. No acute fracture identified in the pelvis. Bones are osteopenic. IMPRESSION: Acute intertrochanteric fracture of the left femur. Electronically Signed   By: Ofilia Neas M.D.   On: 07/25/2021 11:13   CT Hip Left Wo Contrast  Result Date: 07/25/2021 CLINICAL DATA:  Fall at home, hip fracture EXAM: CT OF THE LEFT HIP WITHOUT CONTRAST TECHNIQUE: Multidetector CT imaging of the left hip was performed according to the standard protocol. Multiplanar CT image reconstructions were also generated. COMPARISON:  Radiographs performed earlier on the same date. FINDINGS: Bones/Joint/Cartilage There is displaced intertrochanteric fracture of the left femur with superolateral displacement of the distal femur. There is approximately 11 mm lateral displacement at the lesser trochanter. The femoral head is located in the acetabulum. No other appreciable fracture. Ligaments Suboptimally assessed by CT. Muscles and Tendons Muscles are normal in bulk and density. No intramuscular hematoma/seroma. Soft tissues Atherosclerotic calcification of the femoral vessels. Subcutaneous fascial stranding about the lateral aspect of the hip. No drainable fluid collection or hematoma/seroma. IMPRESSION: Displaced intertrochanteric fracture of the left femur with superolateral displacement of the distal femur. No other fracture or dislocation. No significant soft tissue abnormality. Electronically Signed   By: Keane Police D.O.   On: 07/25/2021 14:35   DG C-Arm 1-60 Min-No Report  Result Date: 07/26/2021 Fluoroscopy was utilized by the requesting physician.  No radiographic interpretation.   DG C-Arm 1-60 Min-No Report  Result Date: 07/26/2021 Fluoroscopy was utilized by the requesting physician.  No radiographic interpretation.   DG C-Arm 1-60 Min-No Report  Result Date: 07/26/2021 Fluoroscopy was utilized  by the requesting physician.  No radiographic interpretation.   DG HIP UNILAT WITH PELVIS 2-3 VIEWS LEFT  Result Date: 07/26/2021 CLINICAL DATA:  Left proximal femoral fracture EXAM: DG HIP (WITH OR WITHOUT PELVIS) 2-3V LEFT COMPARISON:  None. FLUOROSCOPY TIME:  Radiation Exposure Index (as provided by the fluoroscopic device): Not available If the device does not provide the exposure index: Fluoroscopy Time:  2 minutes 47 seconds Number of Acquired Images:  5 FINDINGS: Medullary rod is noted in the proximal femur. Two fixation screws are noted traversing the femoral neck. A more distal fixation screw is noted in the proximal diaphysis of the femur. Fracture fragments are in near anatomic alignment. IMPRESSION: ORIF of proximal left femoral fracture. Electronically Signed   By: Inez Catalina M.D.   On: 07/26/2021 18:52   DG Femur Min 2 Views Left  Result Date: 07/25/2021 CLINICAL DATA:  Fall, hip pain EXAM: LEFT FEMUR 2 VIEWS COMPARISON:  None. FINDINGS: Acute intertrochanteric fracture of the left femur with up to 12 mm displacement at the lesser trochanter. No fracture identified in the more distal femur.  Bones are osteopenic. Arterial vascular calcifications noted. IMPRESSION: Acute intertrochanteric fracture of the left femur. Electronically Signed   By: Ofilia Neas M.D.   On: 07/25/2021 11:13     Scheduled Meds:  aspirin EC  81 mg Oral Daily   atorvastatin  40 mg Oral QPM   Chlorhexidine Gluconate Cloth  6 each Topical Daily   donepezil  10 mg Oral QHS   enoxaparin (LOVENOX) injection  40 mg Subcutaneous Q24H   feeding supplement  237 mL Oral TID BM   memantine  10 mg Oral BID   multivitamin with minerals  1 tablet Oral Daily   pantoprazole  40 mg Oral Daily   potassium chloride  10 mEq Oral Daily   ramipril  5 mg Oral Daily   sertraline  25 mg Oral Daily   traZODone  50 mg Oral QHS   cyanocobalamin  1,000 mcg Oral Daily   Continuous Infusions:   ceFAZolin (ANCEF) IV Stopped  (07/27/21 0618)   lactated ringers 50 mL/hr at 07/27/21 0647     LOS: 2 days   Time spent: [place], MD [facility] To contact the attending provider between 7A-7P or the covering provider during after hours 7P-7A, please log into the web site [email or web address] and access using universal [place] password for that web site. If you do not have the password, please call the hospital operator.  07/27/2021, 10:19 AM

## 2021-07-27 NOTE — Evaluation (Signed)
Occupational Therapy Evaluation Patient Details Name: Marcus Shepherd MRN: [id number] DOB: Jan 16, 1941 Today's Date: 07/27/2021   History of Present Illness Marcus Shepherd is a 80 y.o. male presents emergency department via EMS after a fall at home.  Found to have  Left Intertrochanteric Hip Fracture. Patient has baseline dementia. S/p L IM nail on 07/27/21.   Clinical Impression   Marcus Shepherd was seen for OT evaluation this date. Prior to hospital admission, Marcus Shepherd was Independent for mobility and ADLs. Marcus Shepherd lives with spouse. Marcus Shepherd presents to acute OT demonstrating impaired ADL performance and functional mobility 2/2 decreased activity tolerance and functional strength/ROM/balance deficits. Marcus Shepherd currently requires MAX A don/doff B socks. MAX A don/doff gown seated EOB - no assist for balance, assist to sequence. MAX A x2 + RW sit<>stand x2, unable to achieve upright posture. Marcus Shepherd would benefit from skilled OT to address noted impairments and functional limitations (see below for any additional details) in order to maximize safety and independence while minimizing falls risk and caregiver burden. Upon hospital discharge, recommend STR to maximize Marcus Shepherd safety and return to PLOF.       Recommendations for follow up therapy are one component of a multi-disciplinary discharge planning process, led by the attending physician.  Recommendations may be updated based on patient status, additional functional criteria and insurance authorization.   Follow Up Recommendations  Skilled nursing-short term rehab (<3 hours/day)    Assistance Recommended at Discharge Frequent or constant Supervision/Assistance  Functional Status Assessment  Patient has had a recent decline in their functional status and demonstrates the ability to make significant improvements in function in a reasonable and predictable amount of time.  Equipment Recommendations  Other (comment) (defer to next venue of care)    Recommendations for  Other Services       Precautions / Restrictions Precautions Precautions: Fall Restrictions Weight Bearing Restrictions: Yes LLE Weight Bearing: Partial weight bearing LLE Partial Weight Bearing Percentage or Pounds: 50%      Mobility Bed Mobility Overal bed mobility: Needs Assistance Bed Mobility: Rolling;Supine to Sit;Sit to Supine Rolling: Max assist;+2 for physical assistance;+2 for safety/equipment   Supine to sit: Max assist;+2 for physical assistance;+2 for safety/equipment Sit to supine: Max assist;+2 for physical assistance   General bed mobility comments: Marcus Shepherd requires maxA +2 to come into sitting position at EOB.    Transfers Overall transfer level: Needs assistance Equipment used: Rolling walker (2 wheels) Transfers: Sit to/from Stand Sit to Stand: Max assist;+2 physical assistance           General transfer comment: Marcus Shepherd minimally clears buttocks when attempting to stand, however is able to lift posterior side and scoot backwards with using his UE's on the bed.      Balance Overall balance assessment: Needs assistance Sitting-balance support: Feet supported;Bilateral upper extremity supported Sitting balance-Leahy Scale: Fair     Standing balance support: Bilateral upper extremity supported;Reliant on assistive device for balance Standing balance-Leahy Scale: Poor                             ADL either performed or assessed with clinical judgement   ADL Overall ADL's : Needs assistance/impaired                                       General ADL Comments: MAX A don/doff B socks. MAX A don/doff  gown seated EOB - no assist for balance, assist to sequence. MAX A x2 + RW for ADL t/f      Pertinent Vitals/Pain Pain Assessment: Faces Faces Pain Scale: Hurts even more Pain Location: L Hip Pain Descriptors / Indicators: Aching Pain Intervention(s): Limited activity within patient's tolerance;Repositioned     Hand Dominance  Right   Extremity/Trunk Assessment Upper Extremity Assessment Upper Extremity Assessment: Generalized weakness   Lower Extremity Assessment Lower Extremity Assessment: Generalized weakness LLE Deficits / Details: s/p IM Nail of L Femur LLE: Unable to fully assess due to pain       Communication Communication Communication: Expressive difficulties   Cognition Arousal/Alertness: Awake/alert Behavior During Therapy: Restless;Impulsive Overall Cognitive Status: Impaired/Different from baseline Area of Impairment: Orientation;Attention;Following commands;Safety/judgement;Awareness                 Orientation Level: Disoriented to;Place;Time;Situation     Following Commands: Follows one step commands inconsistently Safety/Judgement: Decreased awareness of safety     General Comments: follows 1 step commands inconistently, MAX cuesing for multistep tasks     General Comments       Exercises Exercises: Other exercises Other Exercises Other Exercises: Marcus Shepherd educated re: OT role, DME recs, d/c recs, falls prevention   Shoulder Instructions      Home Living Family/patient expects to be discharged to:: Private residence Living Arrangements: Spouse/significant other Available Help at Discharge: Family Type of Home: House Home Access: Stairs to enter Technical brewer of Steps: 3 Entrance Stairs-Rails: None Home Layout: One level     Bathroom Shower/Tub: Teacher, early years/pre: Handicapped height Bathroom Accessibility: No   Home Equipment: Conservation officer, nature (2 wheels)          Prior Functioning/Environment Prior Level of Function : Independent/Modified Independent;History of Falls (last six months)             Mobility Comments: 3 falls in last 6 months. Marcus Shepherd and Marcus Shepherd ball room dance          OT Problem List: Decreased strength;Decreased range of motion;Decreased activity tolerance;Impaired balance (sitting and/or standing);Decreased  cognition;Decreased safety awareness;Decreased knowledge of use of DME or AE      OT Treatment/Interventions: Self-care/ADL training;Therapeutic exercise;Energy conservation;DME and/or AE instruction;Therapeutic activities;Patient/family education;Balance training    OT Goals(Current goals can be found in the care plan section) Acute Rehab OT Goals Patient Stated Goal: to improve pain OT Goal Formulation: With family Time For Goal Achievement: 08/10/21 Potential to Achieve Goals: Fair ADL Goals Marcus Shepherd Will Perform Grooming: with set-up;with supervision;sitting (c cues for sequencing) Marcus Shepherd Will Perform Lower Body Dressing: with mod assist;sitting/lateral leans Marcus Shepherd Will Transfer to Toilet: with +2 assist;stand pivot transfer;with mod assist;bedside commode (c LRAD PRN)  OT Frequency: Min 1X/week   Barriers to D/C: Inaccessible home environment;Decreased caregiver support          Co-evaluation Marcus Shepherd/OT/SLP Co-Evaluation/Treatment: Yes Reason for Co-Treatment: Necessary to address cognition/behavior during functional activity;For patient/therapist safety;To address functional/ADL transfers Marcus Shepherd goals addressed during session: Mobility/safety with mobility OT goals addressed during session: ADL's and self-care      AM-PAC OT "6 Clicks" Daily Activity     Outcome Measure Help from another person eating meals?: A Lot Help from another person taking care of personal grooming?: A Lot Help from another person toileting, which includes using toliet, bedpan, or urinal?: A Lot Help from another person bathing (including washing, rinsing, drying)?: A Lot Help from another person to put on and taking off regular upper body clothing?: A  Lot Help from another person to put on and taking off regular lower body clothing?: A Lot 6 Click Score: 12   End of Session Equipment Utilized During Treatment: Rolling walker (2 wheels);Gait belt  Activity Tolerance: Patient tolerated treatment well Patient left: in  bed;with call bell/phone within reach;with bed alarm set;with family/visitor present  OT Visit Diagnosis: Other abnormalities of gait and mobility (R26.89);Muscle weakness (generalized) (M62.81)                Time: 8264-1583 OT Time Calculation (min): 24 min Charges:  OT General Charges $OT Visit: 1 Visit OT Evaluation $OT Eval Low Complexity: 1 Low OT Treatments $Self Care/Home Management : 8-22 mins  Dessie Coma, M.S. OTR/L  07/27/21, 12:57 PM  ascom [phone]

## 2021-07-27 NOTE — Plan of Care (Signed)
°  Problem: Education: Goal: Knowledge of General Education information will improve Description: Including pain rating scale, medication(s)/side effects and non-pharmacologic comfort measures Outcome: Not Progressing   Problem: Clinical Measurements: Goal: Ability to maintain clinical measurements within normal limits will improve Outcome: Not Progressing Goal: Will remain free from infection Outcome: Not Progressing Goal: Diagnostic test results will improve Outcome: Not Progressing   Problem: Clinical Measurements: Goal: Will remain free from infection Outcome: Not Progressing   Problem: Clinical Measurements: Goal: Diagnostic test results will improve Outcome: Not Progressing

## 2021-07-27 NOTE — Anesthesia Postprocedure Evaluation (Signed)
Anesthesia Post Note  Patient: Marcus Shepherd  Procedure(s) Performed: INTRAMEDULLARY (IM) NAIL INTERTROCHANTRIC (Left)  Patient location during evaluation: PACU Anesthesia Type: Spinal and General Level of consciousness: awake and patient cooperative Pain management: pain level controlled Vital Signs Assessment: post-procedure vital signs reviewed and stable Respiratory status: spontaneous breathing, nonlabored ventilation and respiratory function stable Cardiovascular status: blood pressure returned to baseline and stable Postop Assessment: adequate PO intake Anesthetic complications: no   No notable events documented.   Last Vitals:  Vitals:   07/26/21 2115 07/26/21 2221  BP: (!) 118/59 115/65  Pulse: (!) 50 (!) 59  Resp: 20 18  Temp: 36.5 C 36.5 C  SpO2: 94% 100%    Last Pain:  Vitals:   07/26/21 2231  TempSrc:   PainSc: Asleep                 Darrin Nipper

## 2021-07-27 NOTE — TOC Progression Note (Signed)
Transition of Care [facility]) - Progression Note    Patient Details  Name: TEMITAYO COVALT MRN: [id number] Date of Birth: 06-19-1941  Transition of Care [facility]) CM/SW [place], RN Phone Number: 07/27/2021, 12:58 PM  Clinical Narrative:   Patient agreeable to STR SNF Bedsearch, PASSR obtained, FL2 complete, Bedsearch sent, will review once offers obtained         Expected Discharge Plan and Services                                                 Social Determinants of Health (SDOH) Interventions    Readmission Risk Interventions No flowsheet data found.

## 2021-07-27 NOTE — Evaluation (Signed)
Physical Therapy Evaluation Patient Details Name: Marcus Shepherd MRN: [id number] DOB: 08-06-1941 Today's Date: 07/27/2021  History of Present Illness  Marcus Shepherd is a 80 y.o. male presents emergency department via EMS after a fall at home.  Patient has baseline dementia.    Clinical Impression  Pt received in Semi-Fowler's position and agreeable to therapy.  Co-treat was performed with OT for safety and management of mobility.  Pt's wife present throughout evaluation.  Pt notes that pt is not at baseline levels cognitively and has been combatant this AM.  Pt pleasant throughout session however speaks with no coherence and understanding of what is going on.  Pt unable to respond to single-step directions.  Pt requires tactile cuing for hand placement on walker and guidance throughout STS attempt.  Pt attempt x2, but was unable to get full clearance off bed.  On last attempt, pt's wife assisted in getting clean chuck under patient while therapists assisted in getting pt into partial standing position.  Pt then transitioned back to bed with maxA throughout all mobility attempts.  Pt left with call bell in place and all needs met.  Pt will benefit from skilled PT intervention to increase independence and safety with basic mobility in preparation for discharge to SNF for STR.        Recommendations for follow up therapy are one component of a multi-disciplinary discharge planning process, led by the attending physician.  Recommendations may be updated based on patient status, additional functional criteria and insurance authorization.  Follow Up Recommendations Skilled nursing-short term rehab (<3 hours/day)    Assistance Recommended at Discharge Frequent or constant Supervision/Assistance  Functional Status Assessment Patient has had a recent decline in their functional status and demonstrates the ability to make significant improvements in function in a reasonable and predictable amount of  time.  Equipment Recommendations  Other (comment) (To be determined by next venue of care.)    Recommendations for Other Services       Precautions / Restrictions Restrictions Weight Bearing Restrictions: Yes LLE Weight Bearing: Partial weight bearing LLE Partial Weight Bearing Percentage or Pounds: 50%      Mobility  Bed Mobility Overal bed mobility: Needs Assistance Bed Mobility: Rolling;Supine to Sit Rolling: Max assist;+2 for physical assistance;+2 for safety/equipment   Supine to sit: Max assist;+2 for physical assistance;+2 for safety/equipment     General bed mobility comments: Pt requires maxA +2 to come into sitting position at EOB.    Transfers Overall transfer level: Needs assistance Equipment used: Rolling walker (2 wheels) Transfers: Sit to/from Stand Sit to Stand: Max assist;+2 physical assistance           General transfer comment: Pt unable to clear buttocks when attempting to stand, however is able to lift posterior side and scoot backwards with using his UE's on the bed.    Ambulation/Gait               General Gait Details: deferred due to safety concerns and being unable to stand.  Stairs            Wheelchair Mobility    Modified Rankin (Stroke Patients Only)       Balance Overall balance assessment: Modified Independent                                           Pertinent Vitals/Pain Pain Assessment: Faces  Faces Pain Scale: Hurts even more Pain Location: L Hip Pain Descriptors / Indicators: Aching Pain Intervention(s): Limited activity within patient's tolerance;Monitored during session;Repositioned    Home Living Family/patient expects to be discharged to:: Private residence Living Arrangements: Spouse/significant other Available Help at Discharge: Family Type of Home: House Home Access: Stairs to enter Entrance Stairs-Rails: None Entrance Stairs-Number of Steps: 3   Home Layout: One  level Home Equipment: Conservation officer, nature (2 wheels)      Prior Function Prior Level of Function : Independent/Modified Independent;History of Falls (last six months) (3 falls within the last 6 months)                     Hand Dominance   Dominant Hand: Right    Extremity/Trunk Assessment   Upper Extremity Assessment Upper Extremity Assessment: Generalized weakness    Lower Extremity Assessment Lower Extremity Assessment: LLE deficits/detail LLE Deficits / Details: s/p IM Nail of L Femur LLE: Unable to fully assess due to pain       Communication   Communication: Other (comment) (Difficulty understanding according to wife.)  Cognition Arousal/Alertness: Awake/alert Behavior During Therapy: Restless;Impulsive Overall Cognitive Status: Impaired/Different from baseline Area of Impairment: Orientation;Attention;Following commands;Safety/judgement;Awareness                 Orientation Level: Disoriented to;Place;Time;Situation       Safety/Judgement: Decreased awareness of safety     General Comments: Pt unable to communicate effectively and does not understand directions/commands.        General Comments      Exercises Other Exercises Other Exercises: Pt and wife educated on roles of PT and services provided during hospital stay.   Assessment/Plan    PT Assessment Patient needs continued PT services  PT Problem List Decreased strength;Decreased activity tolerance;Decreased balance;Decreased mobility;Decreased cognition;Decreased knowledge of use of DME;Decreased safety awareness       PT Treatment Interventions DME instruction;Gait training;Functional mobility training;Therapeutic activities;Therapeutic exercise;Balance training;Neuromuscular re-education    PT Goals (Current goals can be found in the Care Plan section)  Acute Rehab PT Goals Patient Stated Goal: Not able participate in goal formation. PT Goal Formulation: With family Time For Goal  Achievement: 08/10/21 Potential to Achieve Goals: Good    Frequency 7X/week   Barriers to discharge Decreased caregiver support;Inaccessible home environment Bathroom set-up and wife unable to give assistance necessary.    Co-evaluation PT/OT/SLP Co-Evaluation/Treatment: Yes Reason for Co-Treatment: Necessary to address cognition/behavior during functional activity;For patient/therapist safety;To address functional/ADL transfers PT goals addressed during session: Mobility/safety with mobility OT goals addressed during session: ADL's and self-care       AM-PAC PT "6 Clicks" Mobility  Outcome Measure Help needed turning from your back to your side while in a flat bed without using bedrails?: A Lot Help needed moving from lying on your back to sitting on the side of a flat bed without using bedrails?: A Lot Help needed moving to and from a bed to a chair (including a wheelchair)?: Total Help needed standing up from a chair using your arms (e.g., wheelchair or bedside chair)?: Total Help needed to walk in hospital room?: Total Help needed climbing 3-5 steps with a railing? : Total 6 Click Score: 8    End of Session Equipment Utilized During Treatment: Gait belt Activity Tolerance: Patient tolerated treatment well;Patient limited by pain Patient left: in bed;with call bell/phone within reach;with bed alarm set;with family/visitor present Nurse Communication: Mobility status PT Visit Diagnosis: Unsteadiness on feet (R26.81);Other abnormalities of  gait and mobility (R26.89);Repeated falls (R29.6);Muscle weakness (generalized) (M62.81);History of falling (Z91.81);Difficulty in walking, not elsewhere classified (R26.2);Pain Pain - Right/Left: Left Pain - part of body: Hip    Time: 9476-5465 PT Time Calculation (min) (ACUTE ONLY): 27 min   Charges:   PT Evaluation $PT Eval Low Complexity: 1 Low PT Treatments $Therapeutic Activity: 8-22 mins        Gwenlyn Saran, PT,  DPT 07/27/21, 12:47 PM   Christie Nottingham 07/27/2021, 12:44 PM

## 2021-07-27 NOTE — NC FL2 (Signed)
[place] LEVEL OF CARE SCREENING TOOL     IDENTIFICATION  Patient Name: Marcus Shepherd Birthdate: 1941-01-22 Sex: male Admission Date (Current Location): 07/25/2021  [facility] and [place] Number:  Engineering geologist and Address:  [facility], [address] [place], [place] [address]      Provider Number: [id number]  Attending Physician Name and Address:  Nita Sells, MD  Relative Name and Phone Number:  Santiago Glad [phone]    Current Level of Care: Hospital Recommended Level of Care: [place] Prior Approval Number:    Date Approved/Denied:   PASRR Number: [id number]  Discharge Plan: SNF    Current Diagnoses: Patient Active Problem List   Diagnosis Date Noted   Protein-calorie malnutrition, severe 07/26/2021   Hip fracture ([place]) 07/25/2021   Elevated PSA 09/07/2019   Sinus bradycardia 06/04/2018   Personal history of other malignant neoplasm of skin 12/11/2017   Osteoarthritis of left knee 01/30/2016   Anemia, unspecified 04/13/2014   Benign essential hypertension 04/13/2014   CAD (coronary artery disease), autologous vein bypass graft 04/13/2014   Neck pain 04/13/2014   Sinoatrial node dysfunction ([place]) 04/13/2014   CAD (coronary artery disease), native coronary artery 12/31/2013   Dementia ([place]) 12/31/2013   HTN (hypertension) 12/31/2013   Hyperlipidemia, unspecified 12/31/2013    Orientation RESPIRATION BLADDER Height & Weight     Self, Time, Situation, Place  Normal Continent, External catheter Weight: 69.9 kg Height:  5\' 10"  (177.8 cm)  BEHAVIORAL SYMPTOMS/MOOD NEUROLOGICAL BOWEL NUTRITION STATUS      Continent Diet (regular)  AMBULATORY STATUS COMMUNICATION OF NEEDS Skin     Verbally Normal, Surgical wounds                       Personal Care Assistance Level of Assistance              Functional Limitations Info             SPECIAL CARE FACTORS FREQUENCY  PT (By  licensed PT), OT (By licensed OT)     PT Frequency: 5 times per week OT Frequency: 5 times per week            Contractures Contractures Info: Not present    Additional Factors Info  Code Status, Allergies Code Status Info: full code Allergies Info: NKDA           Current Medications (07/27/2021):  This is the current hospital active medication list Current Facility-Administered Medications  Medication Dose Route Frequency Provider Last Rate Last Admin   acetaminophen (TYLENOL) tablet 650 mg  650 mg Oral Q6H PRN Renee Harder, MD       Or   acetaminophen (TYLENOL) suppository 650 mg  650 mg Rectal Q6H PRN Renee Harder, MD       aspirin EC tablet 81 mg  81 mg Oral Daily Renee Harder, MD       atorvastatin (LIPITOR) tablet 40 mg  40 mg Oral QPM Renee Harder, MD       ceFAZolin (ANCEF) IVPB 1 g/50 mL premix  1 g Intravenous Q8H Renee Harder, MD   Stopped at 07/27/21 1275   Chlorhexidine Gluconate Cloth 2 % PADS 6 each  6 each Topical Daily Poggi, Marshall Cork, MD       donepezil (ARICEPT) tablet 10 mg  10 mg Oral Dewain Penning, MD       enoxaparin (LOVENOX) injection 40 mg  40 mg Subcutaneous Q24H Crawford,  Rodman Key, MD       feeding supplement (ENSURE ENLIVE / ENSURE PLUS) liquid 237 mL  237 mL Oral TID BM Renee Harder, MD       HYDROcodone-acetaminophen (NORCO/VICODIN) 5-325 MG per tablet 1-2 tablet  1-2 tablet Oral Q4H PRN Renee Harder, MD       lactated ringers infusion   Intravenous Continuous Renee Harder, MD 50 mL/hr at 07/27/21 [phone] Infusion Verify at 07/27/21 0647   LORazepam (ATIVAN) injection 0.5 mg  0.5 mg Intravenous Q8H PRN Nita Sells, MD       memantine [facility]) tablet 10 mg  10 mg Oral BID Renee Harder, MD       multivitamin with minerals tablet 1 tablet  1 tablet Oral Daily Renee Harder, MD       ondansetron [facility]) tablet 4 mg  4 mg Oral Q6H PRN Renee Harder, MD       Or   ondansetron [facility])  injection 4 mg  4 mg Intravenous Q6H PRN Renee Harder, MD       pantoprazole (PROTONIX) EC tablet 40 mg  40 mg Oral Daily Renee Harder, MD       potassium chloride (KLOR-CON M) CR tablet 10 mEq  10 mEq Oral Daily Renee Harder, MD       ramipril (ALTACE) capsule 5 mg  5 mg Oral Daily Renee Harder, MD       sertraline (ZOLOFT) tablet 25 mg  25 mg Oral Daily Renee Harder, MD       traZODone (DESYREL) tablet 50 mg  50 mg Oral QHS Renee Harder, MD       vitamin B-12 (CYANOCOBALAMIN) tablet 1,000 mcg  1,000 mcg Oral Daily Renee Harder, MD         Discharge Medications: Please see discharge summary for a list of discharge medications.  Relevant Imaging Results:  Relevant Lab Results:   Additional Information SS# [id number]  Conception Oms, RN

## 2021-07-28 LAB — COMPREHENSIVE METABOLIC PANEL
ALT: 10 U/L (ref 0–44)
AST: 24 U/L (ref 15–41)
Albumin: 2.8 g/dL — ABNORMAL LOW (ref 3.5–5.0)
Alkaline Phosphatase: 45 U/L (ref 38–126)
Anion gap: 4 — ABNORMAL LOW (ref 5–15)
BUN: 17 mg/dL (ref 8–23)
CO2: 28 mmol/L (ref 22–32)
Calcium: 8.1 mg/dL — ABNORMAL LOW (ref 8.9–10.3)
Chloride: 104 mmol/L (ref 98–111)
Creatinine, Ser: 0.89 mg/dL (ref 0.61–1.24)
GFR, Estimated: >60 mL/min (ref >60)
Glucose, Bld: 109 mg/dL — ABNORMAL HIGH (ref 70–99)
Potassium: 3.9 mmol/L (ref 3.5–5.1)
Sodium: 136 mmol/L (ref 135–145)
Total Bilirubin: 0.8 mg/dL (ref 0.3–1.2)
Total Protein: 5.4 g/dL — ABNORMAL LOW (ref 6.5–8.1)

## 2021-07-28 LAB — CBC WITH DIFFERENTIAL/PLATELET
Abs Immature Granulocytes: 0.02 10*3/uL (ref 0.00–0.07)
Basophils Absolute: 0 10*3/uL (ref 0.0–0.1)
Basophils Relative: 0 %
Eosinophils Absolute: 0.1 10*3/uL (ref 0.0–0.5)
Eosinophils Relative: 1 %
HCT: 31.5 % — ABNORMAL LOW (ref 39.0–52.0)
Hemoglobin: 10.6 g/dL — ABNORMAL LOW (ref 13.0–17.0)
Immature Granulocytes: 0 %
Lymphocytes Relative: 19 %
Lymphs Abs: 1.3 10*3/uL (ref 0.7–4.0)
MCH: 30.5 pg (ref 26.0–34.0)
MCHC: 33.7 g/dL (ref 30.0–36.0)
MCV: 90.5 fL (ref 80.0–100.0)
Monocytes Absolute: 0.6 10*3/uL (ref 0.1–1.0)
Monocytes Relative: 9 %
Neutro Abs: 5.1 10*3/uL (ref 1.7–7.7)
Neutrophils Relative %: 71 %
Platelets: 207 10*3/uL (ref 150–400)
RBC: 3.48 MIL/uL — ABNORMAL LOW (ref 4.22–5.81)
RDW: 13.2 % (ref 11.5–15.5)
WBC: 7.1 10*3/uL (ref 4.0–10.5)
nRBC: 0 % (ref 0.0–0.2)

## 2021-07-28 LAB — RESP PANEL BY RT-PCR (FLU A&B, COVID) ARPGX2
Influenza A by PCR: NEGATIVE
Influenza B by PCR: NEGATIVE
SARS Coronavirus 2 by RT PCR: NEGATIVE

## 2021-07-28 MED ORDER — ENOXAPARIN SODIUM 40 MG/0.4ML IJ SOSY
40.0000 mg | PREFILLED_SYRINGE | INTRAMUSCULAR | Status: DC
Start: 2021-07-29 — End: 2021-07-31

## 2021-07-28 MED ORDER — OLANZAPINE 5 MG PO TBDP
5.0000 mg | ORAL_TABLET | Freq: Two times a day (BID) | ORAL | Status: DC
  Administered 2021-07-29 – 2021-07-31 (×5): 5 mg via ORAL
  Filled 2021-07-28 (×10): qty 1

## 2021-07-28 MED ORDER — TRAZODONE HCL 50 MG PO TABS: 50.0000 mg | ORAL_TABLET | Freq: Every day | ORAL | 0 refills | Status: DC

## 2021-07-28 MED ORDER — MEMANTINE HCL 10 MG PO TABS: 10.0000 mg | ORAL_TABLET | Freq: Two times a day (BID) | ORAL | 0 refills | Status: DC

## 2021-07-28 MED ORDER — ATORVASTATIN CALCIUM 40 MG PO TABS: 40.0000 mg | ORAL_TABLET | Freq: Every evening | ORAL | Status: DC

## 2021-07-28 MED ORDER — HYDROCODONE-ACETAMINOPHEN 5-325 MG PO TABS: 1.0000 | ORAL_TABLET | ORAL | 0 refills | Status: DC | PRN

## 2021-07-28 MED ORDER — DONEPEZIL HCL 10 MG PO TABS: 10.0000 mg | ORAL_TABLET | Freq: Every day | ORAL | 0 refills | Status: DC

## 2021-07-28 MED ORDER — ACETAMINOPHEN 325 MG PO TABS: 650.0000 mg | ORAL_TABLET | Freq: Four times a day (QID) | ORAL | Status: DC | PRN

## 2021-07-28 MED ORDER — SERTRALINE HCL 25 MG PO TABS: 25.0000 mg | ORAL_TABLET | Freq: Every day | ORAL | 0 refills | Status: DC

## 2021-07-28 NOTE — TOC Progression Note (Signed)
Transition of Care [facility]) - Progression Note    Patient Details  Name: JAI STEIL MRN: [id number] Date of Birth: [birth year]  Transition of Care [facility]) CM/SW [place], RN Phone Number: 07/28/2021, 10:42 AM  Clinical Narrative:   Spoke with the patient's wife Santiago Glad, Reviewed bed offers, [facility], I explained to her the process to apply for Medicaid and that it takes aprox 90 days to get approved, she would like to look into Long term care once her gets it.         Expected Discharge Plan and Services                                                 Social Determinants of Health (SDOH) Interventions    Readmission Risk Interventions No flowsheet data found.

## 2021-07-28 NOTE — Care Management Important Message (Signed)
Important Message  Patient Details  Name: Marcus Shepherd MRN: [id number] Date of Birth: September 06, 1940   Medicare Important Message Given:  N/A - LOS <3 / Initial given by admissions     [name] 07/28/2021, 11:54 AM

## 2021-07-28 NOTE — Discharge Summary (Signed)
Physician Discharge Summary  Marcus Shepherd ZDG:[id number] DOB: [birth year] DOA: 07/25/2021  PCP: Tracie Harrier, MD  Admit date: 07/25/2021 Discharge date: 07/28/2021  Time spent: 33 minutes  Recommendations for Outpatient Follow-up:  Recommend Chem-12 CBC 1 week Follow with either Geri psych in the outpatient setting-May consider Zydis 5 mg twice daily as needed agitation  Discharge Diagnoses:  MAIN problem for hospitalization   Acute hip fracture  Please see below for itemized issues addressed in Circle- refer to other progress notes for clarity if needed  Discharge Condition: Fair  Diet recommendation: Regular  Filed Weights   07/25/21 0949 07/26/21 1455  Weight: 69.9 kg 69.9 kg    History of present illness:  80 year old white male Primary dysphagia stricture status post dilatations at the [place] in the past--diverticulosis on last colonoscopy 2010 CAD with stent follows with Dr. Saralyn Pilar Progressive dementia on meds Elevated PSA declines further evaluation   Found facedown complaining of left hip pain on 12/20-was agitated on initial eval X-ray of pelvis showed left femoral intertrochanteric fracture 1112 White count 6 BUN/creatinine 11/0.9 sodium 136   Orthopedics Dr. Sharlet Salina was consulted--see below    Hospital Course:  L Intertroch # s/p IM nail 12/21 Dr. Sharlet Salina  as per Ortho, Partial weightbearing   Lovenox X 14 days DVT prophylaxis ending on 08/09/2021 dry dressings  Fentanyl IV discontinued  Continue Norco every 4 as needed pain control prescription given for controlled substances-- CAD with prior Cardiac stent Cont Ramipril  5, Lipitor 40 Esophageal strictures in the past, prior diverticulosis Clinically quiescent at this time Continue Protonix 40 daily Alzheimer's dementia Behavioral disturbances Continue Zoloft 25 daily, Namenda 10 twice daily, Aricept 10 at bedtime, trazodone 50 at bedtime- Was treated sparingly with Ativan 0.5 during  hospital stay and consideration can be given for initiation of Zydis 5 mg twice daily at SNF long-term prognosis is guarded with severe dementia-would simplify meds in the outpatient setting to only essential   Discharge Exam: Vitals:   07/28/21 0507 07/28/21 0809  BP: (!) 161/71 (!) 127/58  Pulse: (!) 56 (!) 52  Resp: 17 18  Temp: 98.6 F (37 C) 98 F (36.7 C)  SpO2: 99% 98%    Subj on day of d/c   Sleepy but was not taking in much meds this morning Is redirectable did allow some interventions  General Exam on discharge  EOMI NCAT no focal deficit CTA B no rales or rhonchi Abdomen soft no rebound Hip wound not  examined at this time Cannot assess neurological or psych given sleepiness   Discharge Instructions   Discharge Instructions     Diet - low sodium heart healthy   Complete by: As directed    Increase activity slowly   Complete by: As directed    No wound care   Complete by: As directed       Allergies as of 07/28/2021   No Known Allergies      Medication List     STOP taking these medications    simvastatin 80 MG tablet Commonly known as: ZOCOR Replaced by: atorvastatin 40 MG tablet       TAKE these medications    acetaminophen 325 MG tablet Commonly known as: TYLENOL Take 2 tablets (650 mg total) by mouth every 6 (six) hours as needed for mild pain (or Fever >/= 101).   aspirin 81 MG EC tablet Take 1 tablet by mouth daily.   atorvastatin 40 MG tablet Commonly known as: LIPITOR Take 1 tablet (  40 mg total) by mouth every evening. Replaces: simvastatin 80 MG tablet   cyanocobalamin 1000 MCG tablet Take by mouth.   donepezil 10 MG tablet Commonly known as: ARICEPT Take 1 tablet (10 mg total) by mouth at bedtime.   enoxaparin 40 MG/0.4ML injection Commonly known as: LOVENOX Inject 0.4 mLs (40 mg total) into the skin daily. Start taking on: July 29, 2021   HYDROcodone-acetaminophen 5-325 MG tablet Commonly known as:  NORCO/VICODIN Take 1-2 tablets by mouth every 4 (four) hours as needed for moderate pain.   memantine 10 MG tablet Commonly known as: NAMENDA Take 1 tablet (10 mg total) by mouth 2 (two) times daily.   multivitamin with minerals Tabs tablet Take 1 tablet by mouth daily.   pantoprazole 40 MG tablet Commonly known as: PROTONIX Take 40 mg by mouth daily.   potassium chloride 10 MEQ tablet Commonly known as: KLOR-CON Take 1 tablet by mouth daily.   ramipril 5 MG capsule Commonly known as: ALTACE Take 5 mg by mouth daily.   sertraline 25 MG tablet Commonly known as: ZOLOFT Take 1 tablet (25 mg total) by mouth daily. What changed:  how much to take how to take this when to take this   traZODone 50 MG tablet Commonly known as: DESYREL Take 1 tablet (50 mg total) by mouth at bedtime. What changed:  how much to take when to take this       No Known Allergies  Contact information for after-discharge care     Destination     [facility] [place] [facility] Preferred [facility] .   Service: Skilled Nursing Contact information: [address] [place] [phone]                      The results of significant diagnostics from this hospitalization (including imaging, microbiology, ancillary and laboratory) are listed below for reference.    Significant Diagnostic Studies: DG Pelvis 1-2 Views  Result Date: 07/25/2021 CLINICAL DATA:  Fall, hip pain EXAM: PELVIS - 1-2 VIEW COMPARISON:  None. FINDINGS: Acute intertrochanteric fracture of the left femur partially visualized. No acute fracture identified in the pelvis. Bones are osteopenic. IMPRESSION: Acute intertrochanteric fracture of the left femur. Electronically Signed   By: Ofilia Neas M.D.   On: 07/25/2021 11:13   CT Hip Left Wo Contrast  Result Date: 07/25/2021 CLINICAL DATA:  Fall at home, hip fracture EXAM: CT OF THE LEFT HIP WITHOUT CONTRAST TECHNIQUE: Multidetector CT  imaging of the left hip was performed according to the standard protocol. Multiplanar CT image reconstructions were also generated. COMPARISON:  Radiographs performed earlier on the same date. FINDINGS: Bones/Joint/Cartilage There is displaced intertrochanteric fracture of the left femur with superolateral displacement of the distal femur. There is approximately 11 mm lateral displacement at the lesser trochanter. The femoral head is located in the acetabulum. No other appreciable fracture. Ligaments Suboptimally assessed by CT. Muscles and Tendons Muscles are normal in bulk and density. No intramuscular hematoma/seroma. Soft tissues Atherosclerotic calcification of the femoral vessels. Subcutaneous fascial stranding about the lateral aspect of the hip. No drainable fluid collection or hematoma/seroma. IMPRESSION: Displaced intertrochanteric fracture of the left femur with superolateral displacement of the distal femur. No other fracture or dislocation. No significant soft tissue abnormality. Electronically Signed   By: Keane Police D.O.   On: 07/25/2021 14:35   DG C-Arm 1-60 Min-No Report  Result Date: 07/26/2021 Fluoroscopy was utilized by the requesting physician.  No radiographic interpretation.  DG C-Arm 1-60 Min-No Report  Result Date: 07/26/2021 Fluoroscopy was utilized by the requesting physician.  No radiographic interpretation.   DG C-Arm 1-60 Min-No Report  Result Date: 07/26/2021 Fluoroscopy was utilized by the requesting physician.  No radiographic interpretation.   DG HIP UNILAT WITH PELVIS 2-3 VIEWS LEFT  Result Date: 07/26/2021 CLINICAL DATA:  Left proximal femoral fracture EXAM: DG HIP (WITH OR WITHOUT PELVIS) 2-3V LEFT COMPARISON:  None. FLUOROSCOPY TIME:  Radiation Exposure Index (as provided by the fluoroscopic device): Not available If the device does not provide the exposure index: Fluoroscopy Time:  2 minutes 47 seconds Number of Acquired Images:  5 FINDINGS: Medullary rod  is noted in the proximal femur. Two fixation screws are noted traversing the femoral neck. A more distal fixation screw is noted in the proximal diaphysis of the femur. Fracture fragments are in near anatomic alignment. IMPRESSION: ORIF of proximal left femoral fracture. Electronically Signed   By: Inez Catalina M.D.   On: 07/26/2021 18:52   DG Femur Min 2 Views Left  Result Date: 07/25/2021 CLINICAL DATA:  Fall, hip pain EXAM: LEFT FEMUR 2 VIEWS COMPARISON:  None. FINDINGS: Acute intertrochanteric fracture of the left femur with up to 12 mm displacement at the lesser trochanter. No fracture identified in the more distal femur. Bones are osteopenic. Arterial vascular calcifications noted. IMPRESSION: Acute intertrochanteric fracture of the left femur. Electronically Signed   By: Ofilia Neas M.D.   On: 07/25/2021 11:13    Microbiology: Recent Results (from the past 240 hour(s))  Resp Panel by RT-PCR (Flu A&B, Covid) Nasopharyngeal Swab     Status: None   Collection Time: 07/25/21 11:33 AM   Specimen: Nasopharyngeal Swab; Nasopharyngeal(NP) swabs in vial transport medium  Result Value Ref Range Status   SARS Coronavirus 2 by RT PCR NEGATIVE NEGATIVE Final    Comment: (NOTE) SARS-CoV-2 target nucleic acids are NOT DETECTED.  The SARS-CoV-2 RNA is generally detectable in upper respiratory specimens during the acute phase of infection. The lowest concentration of SARS-CoV-2 viral copies this assay can detect is 138 copies/mL. A negative result does not preclude SARS-Cov-2 infection and should not be used as the sole basis for treatment or other patient management decisions. A negative result may occur with  improper specimen collection/handling, submission of specimen other than nasopharyngeal swab, presence of viral mutation(s) within the areas targeted by this assay, and inadequate number of viral copies(<138 copies/mL). A negative result must be combined with clinical observations,  patient history, and epidemiological information. The expected result is Negative.  Fact Sheet for Patients:  EntrepreneurPulse.com.au  Fact Sheet for Healthcare Providers:  IncredibleEmployment.be  This test is no t yet approved or cleared by the [place] FDA and  has been authorized for detection and/or diagnosis of SARS-CoV-2 by FDA under an Emergency Use Authorization (EUA). This EUA will remain  in effect (meaning this test can be used) for the duration of the COVID-19 declaration under Section 564(b)(1) of the Act, 21 U.S.C.section 360bbb-3(b)(1), unless the authorization is terminated  or revoked sooner.       Influenza A by PCR NEGATIVE NEGATIVE Final   Influenza B by PCR NEGATIVE NEGATIVE Final    Comment: (NOTE) The Xpert Xpress SARS-CoV-2/FLU/RSV plus assay is intended as an aid in the diagnosis of influenza from Nasopharyngeal swab specimens and should not be used as a sole basis for treatment. Nasal washings and aspirates are unacceptable for Xpert Xpress SARS-CoV-2/FLU/RSV testing.  Fact Sheet for Patients: EntrepreneurPulse.com.au  Fact Sheet for Healthcare Providers: IncredibleEmployment.be  This test is not yet approved or cleared by the [place] FDA and has been authorized for detection and/or diagnosis of SARS-CoV-2 by FDA under an Emergency Use Authorization (EUA). This EUA will remain in effect (meaning this test can be used) for the duration of the COVID-19 declaration under Section 564(b)(1) of the Act, 21 U.S.C. section 360bbb-3(b)(1), unless the authorization is terminated or revoked.  Performed at [facility], [address] [place], [place] [address]      Labs: Basic Metabolic Panel: Recent Labs  Lab 07/25/21 1133 07/26/21 0348 07/27/21 0255 07/28/21 0505  NA 136 136 134* 136  K 3.6 3.7 4.0 3.9  CL 106 107 105 104  CO2 25 22 23 28   GLUCOSE  118* 106* 161* 109*  BUN 9 11 13 17   CREATININE 1.07 0.97 0.76 0.89  CALCIUM 8.5* 8.6* 8.2* 8.1*   Liver Function Tests: Recent Labs  Lab 07/25/21 1133 07/28/21 0505  AST 19 24  ALT 7 10  ALKPHOS 55 45  BILITOT 0.8 0.8  PROT 5.8* 5.4*  ALBUMIN 3.1* 2.8*   No results for input(s): LIPASE, AMYLASE in the last 168 hours. No results for input(s): AMMONIA in the last 168 hours. CBC: Recent Labs  Lab 07/25/21 1133 07/26/21 0348 07/27/21 0255 07/28/21 0505  WBC 6.8 6.7 6.5 7.1  NEUTROABS 5.1  --  5.7 5.1  HGB 12.6* 12.6* 11.1* 10.6*  HCT 37.1* 37.0* 33.1* 31.5*  MCV 91.6 88.1 90.7 90.5  PLT 223 246 198 207   Cardiac Enzymes: No results for input(s): CKTOTAL, CKMB, CKMBINDEX, TROPONINI in the last 168 hours. BNP: BNP (last 3 results) No results for input(s): BNP in the last 8760 hours.  ProBNP (last 3 results) No results for input(s): PROBNP in the last 8760 hours.  CBG: Recent Labs  Lab 07/25/21 1527  GLUCAP 103*       Signed:  Nita Sells MD   [facility] 07/28/2021, 11:30 AM

## 2021-07-28 NOTE — Progress Notes (Signed)
PT Cancellation Note  Patient Details Name: Marcus Shepherd MRN: [id number] DOB: [birth year]   Cancelled Treatment:    Reason Eval/Treat Not Completed: Fatigue/lethargy limiting ability to participate.  Pt unable to be aroused during treatment today.  According to nursing, pt has been unable to open eyes all morning, but has been communicating with nursing staff.  Upon arrival, pt not responding to questions from therapist, just grunting.  Therapy will continue to see pt as medically appropriate at a later date/time.   Gwenlyn Saran, PT, DPT 07/28/21, 1:25 PM

## 2021-07-28 NOTE — TOC Progression Note (Signed)
Transition of Care [facility]) - Progression Note    Patient Details  Name: Marcus Shepherd MRN: [id number] Date of Birth: 1941/07/10  Transition of Care [facility]) CM/SW [place], RN Phone Number: 07/28/2021, 2:52 PM  Clinical Narrative:   Spoke with the patient's wife in the room, she stated that his family would like for him to go to the [place] SN[facility] due to they also have long term beds, She stated that the daugher called and spoke with Arbie Cookey and they are willing to accept the patient She provided me with the Name and number of Verdell Carmine [phone], I spoke with Arbie Cookey on the phone, she stated that she has not spoken with any family about him but she would take a look at him, Fax number is [phone], I faxed her the Clinical notes and face sheet to review along with my contact information        Expected Discharge Plan and Services           Expected Discharge Date: 07/28/21                                     Social Determinants of Health (SDOH) Interventions    Readmission Risk Interventions No flowsheet data found.

## 2021-07-29 DIAGNOSIS — I25719 Atherosclerosis of autologous vein coronary artery bypass graft(s) with unspecified angina pectoris: Secondary | ICD-10-CM

## 2021-07-29 DIAGNOSIS — E43 Unspecified severe protein-calorie malnutrition: Secondary | ICD-10-CM

## 2021-07-29 DIAGNOSIS — F03918 Unspecified dementia, unspecified severity, with other behavioral disturbance: Secondary | ICD-10-CM

## 2021-07-29 DIAGNOSIS — S72002A Fracture of unspecified part of neck of left femur, initial encounter for closed fracture: Secondary | ICD-10-CM

## 2021-07-29 MED ORDER — HALOPERIDOL LACTATE 5 MG/ML IJ SOLN
2.5000 mg | Freq: Once | INTRAMUSCULAR | Status: AC
  Administered 2021-07-29: 20:00:00 2.5 mg via INTRAMUSCULAR
  Filled 2021-07-29: qty 1

## 2021-07-29 MED ORDER — HALOPERIDOL LACTATE 5 MG/ML IJ SOLN: 2.5000 mg | Freq: Once | INTRAMUSCULAR | Status: DC

## 2021-07-29 NOTE — Assessment & Plan Note (Addendum)
S/p surgery on 12/21 by Dr. Sharlet Salina.  Comfort care.

## 2021-07-29 NOTE — Progress Notes (Signed)
Paged B. Randol Kern, NP - Pt is confused. He has pulled out IV, trying to get out of the bed, and has pulled the tele monitor off. He is not easily redirectable and argumentative. Refuses to take his meds. Thanks  See orders.

## 2021-07-29 NOTE — Progress Notes (Signed)
PT Cancellation Note  Patient Details Name: Marcus Shepherd MRN: [id number] DOB: 02-13-1941   Cancelled Treatment:    Reason Eval/Treat Not Completed: Patient declined, no reason specified; Patient resisting/ pushing therapist away, unable to initiate PT session. Will re-attempt at a later time/date as available and patient medically appropriate for PT. Thank you!    Iva Boop, PT  07/29/21. 8:41 AM

## 2021-07-29 NOTE — Progress Notes (Signed)
°  Progress Note   Patient: Marcus Shepherd [id number]:[id number] DOB: Dec 11, 1940 DOA: 07/25/2021     4 DOS: the patient was seen and examined on 07/29/2021   Brief hospital course: 80 year old white male Primary dysphagia stricture status post dilatations at the [place] in the past--diverticulosis on last colonoscopy 2010, CAD with stent follows with Dr. Saralyn Pilar Progressive dementia on meds, Elevated PSA declines further evaluation   Found facedown complaining of left hip pain on 12/20-was agitated on initial eval. X-ray of pelvis showed left femoral intertrochanteric fracture, 1  12/21 -s/p surgery for left intertrochanteric hip fracture 12/24: Waiting for SNF placement  Assessment and Plan * Hip fracture [facility])- (present on admission) S/p surgery on 12/21 by Dr. Sharlet Salina.  Partial weightbearing.  DVT prophylaxis and pain management per Ortho  Protein-calorie malnutrition, severe- (present on admission) Body mass index is 22.11 kg/m.  Overall very poor prognosis  Dementia ([facility])- (present on admission) Alzheimer's dementia with behavioral disturbances.  Continue Aricept, Namenda, Zyprexa, Zoloft, trazodone  CAD (coronary artery disease), autologous vein bypass graft- (present on admission) Continue aspirin, Lipitor, ramipril     Subjective: Denies any new issues.  Seems confused  Objective Vital signs were reviewed and unremarkable.  80 year old male lying in the bed confused but in no acute distress Lungs clear to auscultation bilaterally, no wheezing rales rhonchi crepitation Cardiovascular S1-S2 normal, no murmur rales or gallop Abdomen soft, benign Neuro: Nonfocal but patient would not cooperate with exam and seems confused  Data Reviewed: My review of labs, imaging, notes and other tests shows no new significant findings.   Family Communication: None at bedside.  Disposition: Status is: Inpatient  Remains inpatient appropriate because: Waiting for SNF placement.  TOC  aware and working on placement   DVT prophylaxis SCDs, Lovenox   Time spent: 35 minutes  Author: Max Sane 07/29/2021 3:35 PM  For on call review [email or web address].

## 2021-07-29 NOTE — Assessment & Plan Note (Addendum)
Body mass index is 22.11 kg/m.  Overall very poor prognosis.  Comfort care.

## 2021-07-29 NOTE — TOC Progression Note (Addendum)
Transition of Care [facility]) - Progression Note    Patient Details  Name: Marcus Shepherd MRN: [id number] Date of Birth: Dec 12, 1940  Transition of Care [facility]) CM/SW [place], LCSW Phone Number: 07/29/2021, 8:44 AM  Clinical Narrative:    Reached out to Lynelle Smoke and Tina at [facility] to inquire why patient was not discharged there yet per MD.  Left VM and sent text message to Tammy and Otila Kluver requesting a call.   10:35- Call to Tammy at [facility] who stated they are waiting on insurance auth and won't be able to take patient until Monday.         Expected Discharge Plan and Services           Expected Discharge Date: 07/28/21                                     Social Determinants of Health (SDOH) Interventions    Readmission Risk Interventions No flowsheet data found.

## 2021-07-29 NOTE — Assessment & Plan Note (Addendum)
Alzheimer's dementia with behavioral disturbances.  Comfort care.

## 2021-07-29 NOTE — Hospital Course (Addendum)
80 year old white male Primary dysphagia stricture status post dilatations at the [place] in the past--diverticulosis on last colonoscopy 2010, CAD with stent follows with Dr. Saralyn Pilar Progressive dementia on meds, Elevated PSA declines further evaluation   Found facedown complaining of left hip pain on 12/20-was agitated on initial eval. X-ray of pelvis showed left femoral intertrochanteric fracture, 1  12/21 -s/p surgery for left intertrochanteric hip fracture 12/24: Waiting for SNF placement 12/25: Family agreeable for comfort care and hospice 12/26: Hospice home eligible but no beds available today

## 2021-07-29 NOTE — Assessment & Plan Note (Addendum)
Comfort care  °

## 2021-07-29 NOTE — Plan of Care (Signed)
?  Problem: Clinical Measurements: ?Goal: Will remain free from infection ?Outcome: Progressing ?  ?

## 2021-07-29 NOTE — Progress Notes (Signed)
Patient has rejected care this shift. He is alert to self. Patient continuously removes telemetry leads and he will not take medicines. His IV remains intact with LR running at 75ml/hr. Patient has remained in bed.

## 2021-07-30 DIAGNOSIS — Z515 Encounter for palliative care: Secondary | ICD-10-CM

## 2021-07-30 LAB — BASIC METABOLIC PANEL
Anion gap: 7 (ref 5–15)
BUN: 15 mg/dL (ref 8–23)
CO2: 23 mmol/L (ref 22–32)
Calcium: 8 mg/dL — ABNORMAL LOW (ref 8.9–10.3)
Chloride: 104 mmol/L (ref 98–111)
Creatinine, Ser: 0.86 mg/dL (ref 0.61–1.24)
GFR, Estimated: >60 mL/min (ref >60)
Glucose, Bld: 102 mg/dL — ABNORMAL HIGH (ref 70–99)
Potassium: 3.5 mmol/L (ref 3.5–5.1)
Sodium: 134 mmol/L — ABNORMAL LOW (ref 135–145)

## 2021-07-30 LAB — CBC
HCT: 34.7 % — ABNORMAL LOW (ref 39.0–52.0)
Hemoglobin: 11.8 g/dL — ABNORMAL LOW (ref 13.0–17.0)
MCH: 30.3 pg (ref 26.0–34.0)
MCHC: 34 g/dL (ref 30.0–36.0)
MCV: 89.2 fL (ref 80.0–100.0)
Platelets: 242 10*3/uL (ref 150–400)
RBC: 3.89 MIL/uL — ABNORMAL LOW (ref 4.22–5.81)
RDW: 12.8 % (ref 11.5–15.5)
WBC: 5 10*3/uL (ref 4.0–10.5)
nRBC: 0 % (ref 0.0–0.2)

## 2021-07-30 MED ORDER — GLYCOPYRROLATE 0.2 MG/ML IJ SOLN
0.2000 mg | INTRAMUSCULAR | Status: DC | PRN
  Filled 2021-07-30: qty 1

## 2021-07-30 MED ORDER — MORPHINE SULFATE (PF) 2 MG/ML IV SOLN: 2.0000 mg | INTRAVENOUS | Status: DC | PRN

## 2021-07-30 MED ORDER — HALOPERIDOL LACTATE 5 MG/ML IJ SOLN
2.5000 mg | INTRAMUSCULAR | Status: DC | PRN
  Administered 2021-07-31: 03:00:00 5 mg via INTRAVENOUS
  Filled 2021-07-30: qty 1

## 2021-07-30 MED ORDER — POLYVINYL ALCOHOL 1.4 % OP SOLN
1.0000 [drp] | Freq: Four times a day (QID) | OPHTHALMIC | Status: DC | PRN
  Filled 2021-07-30: qty 15

## 2021-07-30 MED ORDER — ACETAMINOPHEN 650 MG RE SUPP: 650.0000 mg | Freq: Four times a day (QID) | RECTAL | Status: DC | PRN

## 2021-07-30 MED ORDER — LORAZEPAM 2 MG/ML IJ SOLN: 2.0000 mg | INTRAMUSCULAR | Status: DC | PRN

## 2021-07-30 MED ORDER — ONDANSETRON HCL 4 MG/2ML IJ SOLN: 4.0000 mg | Freq: Four times a day (QID) | INTRAMUSCULAR | Status: DC | PRN

## 2021-07-30 MED ORDER — ACETAMINOPHEN 325 MG PO TABS: 650.0000 mg | ORAL_TABLET | Freq: Four times a day (QID) | ORAL | Status: DC | PRN

## 2021-07-30 MED ORDER — GLYCOPYRROLATE 1 MG PO TABS
1.0000 mg | ORAL_TABLET | ORAL | Status: DC | PRN
  Filled 2021-07-30: qty 1

## 2021-07-30 MED ORDER — DIPHENHYDRAMINE HCL 50 MG/ML IJ SOLN: 25.0000 mg | INTRAMUSCULAR | Status: DC | PRN

## 2021-07-30 MED ORDER — HALOPERIDOL LACTATE 5 MG/ML IJ SOLN: 1.0000 mg | Freq: Four times a day (QID) | INTRAMUSCULAR | Status: DC | PRN

## 2021-07-30 MED ORDER — ONDANSETRON 4 MG PO TBDP
4.0000 mg | ORAL_TABLET | Freq: Four times a day (QID) | ORAL | Status: DC | PRN
  Filled 2021-07-30: qty 1

## 2021-07-30 NOTE — Assessment & Plan Note (Addendum)
He is hospice home appropriate.  No beds available.  Discussed with hospice liaison.  Likely discharge tomorrow to hospice home if bed available

## 2021-07-30 NOTE — Progress Notes (Signed)
°  Progress Note   Patient: Marcus Shepherd YPP:[id number] DOB: [birth year] DOA: 07/25/2021     5 DOS: the patient was seen and examined on 07/30/2021   Brief hospital course: 80 year old white male Primary dysphagia stricture status post dilatations at the [place] in the past--diverticulosis on last colonoscopy 2010, CAD with stent follows with Dr. Saralyn Pilar Progressive dementia on meds, Elevated PSA declines further evaluation   Found facedown complaining of left hip pain on 12/20-was agitated on initial eval. X-ray of pelvis showed left femoral intertrochanteric fracture, 1  12/21 -s/p surgery for left intertrochanteric hip fracture 12/24: Waiting for SNF placement 12/25: Family agreeable for comfort care and hospice  Assessment and Plan * Hip fracture [facility])- (present on admission) S/p surgery on 12/21 by Dr. Sharlet Salina.  Comfort care  Palliative care encounter I had discussion with patient's wife and son(Marcus Shepherd) over phone who are patient's decision maker.  They are agreeable to keep him comfortable.  I have ordered comfort care orders.  They have requested hospice.  Patient's prognosis is less than 2-week.  He has not eaten anything over several days.  ADL 6 out of 6.  He is actively dying.   He seems appropriate for hospice home  Protein-calorie malnutrition, severe- (present on admission) Body mass index is 22.11 kg/m.  Overall very poor prognosis.  Comfort care  Dementia [facility])- (present on admission) Alzheimer's dementia with behavioral disturbances.  Comfort care  CAD (coronary artery disease), autologous vein bypass graft- (present on admission)  Comfort care   Subjective: Actively dying, obtunded.  Sitter at bedside  Objective Vital signs were reviewed and unremarkable.  80 year old male lying in the bed confused/obtunded.  Seem to be actively dying Lungs clear to auscultation bilaterally, no wheezing rales rhonchi crepitation Cardiovascular S1-S2 normal, no murmur rales or  gallop Abdomen soft, benign Neuro: He is obtunded  Data Reviewed: Hypoalbuminemia anemia,  Family Communication: Discussed with patient's wife and son/Marcus Shepherd over phone.  They are agreeable for hospice and keeping him comfort care only.  I have requested hospice evaluation.  He seem to be actively dying with ADLs 6 out of 6.  He has not had much p.o. intake at all and has lost significant weight according to wife.    His prognosis is less than 2 weeks.  He seems appropriate for hospice home  Disposition: Status is: Inpatient  Remains inpatient appropriate because: Actively dying.  Await hospice evaluation     Time spent: 35 minutes  Author: Max Sane 07/30/2021 12:34 PM  For on call review [email or web address].

## 2021-07-30 NOTE — Progress Notes (Signed)
Order Requisition for End of Life. Spoke with sitter who shared wife will be present later. Advised staff to contact chaplain if needed for support.

## 2021-07-30 NOTE — TOC Progression Note (Signed)
Transition of Care [facility]) - Progression Note    Patient Details  Name: Marcus Shepherd MRN: [id number] Date of Birth: 1941/04/28  Transition of Care [facility]) CM/SW Contact  Izola Price, RN Phone Number: 07/30/2021, 11:31 AM  Clinical Narrative:  Per provider family is on board with a change to hospice either at home or residential. [facility] on call at [facility] and faxed requested documents to [phone]. Updated provider. Simmie Davies RN CM           Expected Discharge Plan and Services           Expected Discharge Date: 07/28/21                                     Social Determinants of Health (SDOH) Interventions    Readmission Risk Interventions No flowsheet data found.

## 2021-07-30 NOTE — Progress Notes (Signed)
Pt too confused and combative to get morning VS.

## 2021-07-31 MED ORDER — POLYVINYL ALCOHOL 1.4 % OP SOLN
1.0000 [drp] | Freq: Four times a day (QID) | OPHTHALMIC | 0 refills | Status: AC | PRN
Start: 2021-07-31 — End: ?

## 2021-07-31 MED ORDER — GLYCOPYRROLATE 1 MG PO TABS: 1.0000 mg | ORAL_TABLET | ORAL | Status: AC | PRN

## 2021-07-31 MED ORDER — MORPHINE SULFATE (CONCENTRATE) 10 MG /0.5 ML PO SOLN: 5.0000 mg | ORAL | 0 refills | Status: AC | PRN

## 2021-07-31 MED ORDER — LORAZEPAM 0.5 MG PO TABS: 0.5000 mg | ORAL_TABLET | Freq: Three times a day (TID) | ORAL | 0 refills | Status: AC | PRN

## 2021-07-31 NOTE — Progress Notes (Signed)
OT Cancellation Note  Patient Details Name: Marcus Shepherd MRN: [id number] DOB: 05/15/1941   Cancelled Treatment:     Pt transitioning to comfort care only at this time.  No OT interventions planned at this time.  Please re-consult as medically necessary.  Marcus Shepherd, Marcus Shepherd, Marcus Shepherd   Marcus Shepherd 07/31/2021, 12:16 PM

## 2021-07-31 NOTE — Discharge Summary (Addendum)
Physician Discharge Summary   Patient: Marcus Shepherd MRN: [id number] DOB: @DOB   Admit date:     07/25/2021  Discharge date: 08/01/21  Discharge Physician: Max Sane   PCP: Tracie Harrier, MD   Recommendations at discharge: 1. Hospice   Discharge Diagnoses Principal Problem:   Hip fracture ([place]) Active Problems:   CAD (coronary artery disease), autologous vein bypass graft   Dementia (HCC)   Protein-calorie malnutrition, severe   Palliative care encounter  Hospital Course   80 year old white male Primary dysphagia stricture status post dilatations at the [place] in the past--diverticulosis on last colonoscopy 2010, CAD with stent follows with Dr. Saralyn Pilar Progressive dementia on meds, Elevated PSA declines further evaluation   Found facedown complaining of left hip pain on 12/20-was agitated on initial eval. X-ray of pelvis showed left femoral intertrochanteric fracture, 1  12/21 -s/p surgery for left intertrochanteric hip fracture 12/24: Waiting for SNF placement 12/25: Family agreeable for comfort care and hospice 12/26: Hospice home eligible but no beds available today 12/27 - Hospice home bed available and being D/C there  * Hip fracture [facility])- (present on admission) S/p surgery on 12/21 by Dr. Sharlet Salina.  Comfort care.  Palliative care encounter He is hospice home appropriate.  No beds available.  Discussed with hospice liaison.  Likely discharge tomorrow to hospice home if bed available  Protein-calorie malnutrition, severe- (present on admission) Body mass index is 22.11 kg/m.  Overall very poor prognosis.  Comfort care.  Dementia ([place])- (present on admission) Alzheimer's dementia with behavioral disturbances.  Comfort care.  CAD (coronary artery disease), autologous vein bypass graft- (present on admission)  Comfort care.  Hospice Home  Consultants: Ortho Procedures performed: Hip surgery on 12/21  Disposition: Hospice care Diet recommendation: NPO  except meds/pleasure feeding  DISCHARGE MEDICATION: Allergies as of 08/01/2021   No Known Allergies      Medication List     STOP taking these medications    aspirin 81 MG EC tablet   cyanocobalamin 1000 MCG tablet   donepezil 10 MG tablet Commonly known as: ARICEPT   memantine 10 MG tablet Commonly known as: NAMENDA   multivitamin with minerals Tabs tablet   pantoprazole 40 MG tablet Commonly known as: PROTONIX   potassium chloride 10 MEQ tablet Commonly known as: KLOR-CON   ramipril 5 MG capsule Commonly known as: ALTACE   sertraline 25 MG tablet Commonly known as: ZOLOFT   simvastatin 80 MG tablet Commonly known as: ZOCOR   traZODone 50 MG tablet Commonly known as: DESYREL       TAKE these medications    glycopyrrolate 1 MG tablet Commonly known as: ROBINUL Take 1 tablet (1 mg total) by mouth every 4 (four) hours as needed (excessive secretions).   LORazepam 0.5 MG tablet Commonly known as: Ativan Take 1 tablet (0.5 mg total) by mouth every 8 (eight) hours as needed for anxiety.   morphine CONCENTRATE 10 mg / 0.5 ml concentrated solution Take 0.25 mLs (5 mg total) by mouth every 2 (two) hours as needed for severe pain.   polyvinyl alcohol 1.4 % ophthalmic solution Commonly known as: LIQUIFILM TEARS Place 1 drop into both eyes 4 (four) times daily as needed for dry eyes.        Contact information for after-discharge care     Destination     [facility] Preferred SNF .   Service: Skilled Chiropodist information: [address] [place] [phone]  Discharge Exam: Filed Weights   07/25/21 0949 07/26/21 1455  Weight: 69.9 kg 26.51 kg   80 year old male lying in the bed confused/obtunded.  Actively dying Lungs clear to auscultation bilaterally, no wheezing rales rhonchi crepitation Cardiovascular S1-S2 normal, no murmur rales or gallop Abdomen soft,  benign Neuro: He is obtunded  Condition at discharge: critical  The results of significant diagnostics from this hospitalization (including imaging, microbiology, ancillary and laboratory) are listed below for reference.   Imaging Studies: DG Pelvis 1-2 Views  Result Date: 07/25/2021 CLINICAL DATA:  Fall, hip pain EXAM: PELVIS - 1-2 VIEW COMPARISON:  None. FINDINGS: Acute intertrochanteric fracture of the left femur partially visualized. No acute fracture identified in the pelvis. Bones are osteopenic. IMPRESSION: Acute intertrochanteric fracture of the left femur. Electronically Signed   By: Ofilia Neas M.D.   On: 07/25/2021 11:13   CT Hip Left Wo Contrast  Result Date: 07/25/2021 CLINICAL DATA:  Fall at home, hip fracture EXAM: CT OF THE LEFT HIP WITHOUT CONTRAST TECHNIQUE: Multidetector CT imaging of the left hip was performed according to the standard protocol. Multiplanar CT image reconstructions were also generated. COMPARISON:  Radiographs performed earlier on the same date. FINDINGS: Bones/Joint/Cartilage There is displaced intertrochanteric fracture of the left femur with superolateral displacement of the distal femur. There is approximately 11 mm lateral displacement at the lesser trochanter. The femoral head is located in the acetabulum. No other appreciable fracture. Ligaments Suboptimally assessed by CT. Muscles and Tendons Muscles are normal in bulk and density. No intramuscular hematoma/seroma. Soft tissues Atherosclerotic calcification of the femoral vessels. Subcutaneous fascial stranding about the lateral aspect of the hip. No drainable fluid collection or hematoma/seroma. IMPRESSION: Displaced intertrochanteric fracture of the left femur with superolateral displacement of the distal femur. No other fracture or dislocation. No significant soft tissue abnormality. Electronically Signed   By: Keane Police D.O.   On: 07/25/2021 14:35   DG C-Arm 1-60 Min-No Report  Result  Date: 07/26/2021 Fluoroscopy was utilized by the requesting physician.  No radiographic interpretation.   DG C-Arm 1-60 Min-No Report  Result Date: 07/26/2021 Fluoroscopy was utilized by the requesting physician.  No radiographic interpretation.   DG C-Arm 1-60 Min-No Report  Result Date: 07/26/2021 Fluoroscopy was utilized by the requesting physician.  No radiographic interpretation.   DG HIP UNILAT WITH PELVIS 2-3 VIEWS LEFT  Result Date: 07/26/2021 CLINICAL DATA:  Left proximal femoral fracture EXAM: DG HIP (WITH OR WITHOUT PELVIS) 2-3V LEFT COMPARISON:  None. FLUOROSCOPY TIME:  Radiation Exposure Index (as provided by the fluoroscopic device): Not available If the device does not provide the exposure index: Fluoroscopy Time:  2 minutes 47 seconds Number of Acquired Images:  5 FINDINGS: Medullary rod is noted in the proximal femur. Two fixation screws are noted traversing the femoral neck. A more distal fixation screw is noted in the proximal diaphysis of the femur. Fracture fragments are in near anatomic alignment. IMPRESSION: ORIF of proximal left femoral fracture. Electronically Signed   By: Inez Catalina M.D.   On: 07/26/2021 18:52   DG Femur Min 2 Views Left  Result Date: 07/25/2021 CLINICAL DATA:  Fall, hip pain EXAM: LEFT FEMUR 2 VIEWS COMPARISON:  None. FINDINGS: Acute intertrochanteric fracture of the left femur with up to 12 mm displacement at the lesser trochanter. No fracture identified in the more distal femur. Bones are osteopenic. Arterial vascular calcifications noted. IMPRESSION: Acute intertrochanteric fracture of the left femur. Electronically Signed   By: Tiburcio Pea.D.  On: 07/25/2021 11:13    Microbiology: Results for orders placed or performed during the hospital encounter of 07/25/21  Resp Panel by RT-PCR (Flu A&B, Covid) Nasopharyngeal Swab     Status: None   Collection Time: 07/25/21 11:33 AM   Specimen: Nasopharyngeal Swab; Nasopharyngeal(NP) swabs in  vial transport medium  Result Value Ref Range Status   SARS Coronavirus 2 by RT PCR NEGATIVE NEGATIVE Final    Comment: (NOTE) SARS-CoV-2 target nucleic acids are NOT DETECTED.  The SARS-CoV-2 RNA is generally detectable in upper respiratory specimens during the acute phase of infection. The lowest concentration of SARS-CoV-2 viral copies this assay can detect is 138 copies/mL. A negative result does not preclude SARS-Cov-2 infection and should not be used as the sole basis for treatment or other patient management decisions. A negative result may occur with  improper specimen collection/handling, submission of specimen other than nasopharyngeal swab, presence of viral mutation(s) within the areas targeted by this assay, and inadequate number of viral copies(<138 copies/mL). A negative result must be combined with clinical observations, patient history, and epidemiological information. The expected result is Negative.  Fact Sheet for Patients:  EntrepreneurPulse.com.au  Fact Sheet for Healthcare Providers:  IncredibleEmployment.be  This test is no t yet approved or cleared by the [place] FDA and  has been authorized for detection and/or diagnosis of SARS-CoV-2 by FDA under an Emergency Use Authorization (EUA). This EUA will remain  in effect (meaning this test can be used) for the duration of the COVID-19 declaration under Section 564(b)(1) of the Act, 21 U.S.C.section 360bbb-3(b)(1), unless the authorization is terminated  or revoked sooner.       Influenza A by PCR NEGATIVE NEGATIVE Final   Influenza B by PCR NEGATIVE NEGATIVE Final    Comment: (NOTE) The Xpert Xpress SARS-CoV-2/FLU/RSV plus assay is intended as an aid in the diagnosis of influenza from Nasopharyngeal swab specimens and should not be used as a sole basis for treatment. Nasal washings and aspirates are unacceptable for Xpert Xpress SARS-CoV-2/FLU/RSV testing.  Fact  Sheet for Patients: EntrepreneurPulse.com.au  Fact Sheet for Healthcare Providers: IncredibleEmployment.be  This test is not yet approved or cleared by the [place] FDA and has been authorized for detection and/or diagnosis of SARS-CoV-2 by FDA under an Emergency Use Authorization (EUA). This EUA will remain in effect (meaning this test can be used) for the duration of the COVID-19 declaration under Section 564(b)(1) of the Act, 21 U.S.C. section 360bbb-3(b)(1), unless the authorization is terminated or revoked.  Performed at [facility], [place], [place] [address]   Resp Panel by RT-PCR (Flu A&B, Covid) Nasopharyngeal Swab     Status: None   Collection Time: 07/28/21 11:28 AM   Specimen: Nasopharyngeal Swab; Nasopharyngeal(NP) swabs in vial transport medium  Result Value Ref Range Status   SARS Coronavirus 2 by RT PCR NEGATIVE NEGATIVE Final    Comment: (NOTE) SARS-CoV-2 target nucleic acids are NOT DETECTED.  The SARS-CoV-2 RNA is generally detectable in upper respiratory specimens during the acute phase of infection. The lowest concentration of SARS-CoV-2 viral copies this assay can detect is 138 copies/mL. A negative result does not preclude SARS-Cov-2 infection and should not be used as the sole basis for treatment or other patient management decisions. A negative result may occur with  improper specimen collection/handling, submission of specimen other than nasopharyngeal swab, presence of viral mutation(s) within the areas targeted by this assay, and inadequate number of viral copies(<138 copies/mL). A negative result  must be combined with clinical observations, patient history, and epidemiological information. The expected result is Negative.  Fact Sheet for Patients:  EntrepreneurPulse.com.au  Fact Sheet for Healthcare Providers:  IncredibleEmployment.be  This  test is no t yet approved or cleared by the [place] FDA and  has been authorized for detection and/or diagnosis of SARS-CoV-2 by FDA under an Emergency Use Authorization (EUA). This EUA will remain  in effect (meaning this test can be used) for the duration of the COVID-19 declaration under Section 564(b)(1) of the Act, 21 U.S.C.section 360bbb-3(b)(1), unless the authorization is terminated  or revoked sooner.       Influenza A by PCR NEGATIVE NEGATIVE Final   Influenza B by PCR NEGATIVE NEGATIVE Final    Comment: (NOTE) The Xpert Xpress SARS-CoV-2/FLU/RSV plus assay is intended as an aid in the diagnosis of influenza from Nasopharyngeal swab specimens and should not be used as a sole basis for treatment. Nasal washings and aspirates are unacceptable for Xpert Xpress SARS-CoV-2/FLU/RSV testing.  Fact Sheet for Patients: EntrepreneurPulse.com.au  Fact Sheet for Healthcare Providers: IncredibleEmployment.be  This test is not yet approved or cleared by the [place] FDA and has been authorized for detection and/or diagnosis of SARS-CoV-2 by FDA under an Emergency Use Authorization (EUA). This EUA will remain in effect (meaning this test can be used) for the duration of the COVID-19 declaration under Section 564(b)(1) of the Act, 21 U.S.C. section 360bbb-3(b)(1), unless the authorization is terminated or revoked.  Performed at [facility], [address] [place], [place] [address]     Labs: CBC: Recent Labs  Lab 07/25/21 1133 07/26/21 0348 07/27/21 0255 07/28/21 0505 07/30/21 0746  WBC 6.8 6.7 6.5 7.1 5.0  NEUTROABS 5.1  --  5.7 5.1  --   HGB 12.6* 12.6* 11.1* 10.6* 11.8*  HCT 37.1* 37.0* 33.1* 31.5* 34.7*  MCV 91.6 88.1 90.7 90.5 89.2  PLT 223 246 198 207 540   Basic Metabolic Panel: Recent Labs  Lab 07/25/21 1133 07/26/21 0348 07/27/21 0255 07/28/21 0505 07/30/21 0746  NA 136 136 134* 136 134*  K 3.6  3.7 4.0 3.9 3.5  CL 106 107 105 104 104  CO2 25 22 23 28 23   GLUCOSE 118* 106* 161* 109* 102*  BUN 9 11 13 17 15   CREATININE 1.07 0.97 0.76 0.89 0.86  CALCIUM 8.5* 8.6* 8.2* 8.1* 8.0*   Liver Function Tests: Recent Labs  Lab 07/25/21 1133 07/28/21 0505  AST 19 24  ALT 7 10  ALKPHOS 55 45  BILITOT 0.8 0.8  PROT 5.8* 5.4*  ALBUMIN 3.1* 2.8*   CBG: Recent Labs  Lab 07/25/21 1527  GLUCAP 103*    Discharge time spent: greater than 30 minutes.  Signed:  Max Sane MD.  [facility] 08/01/2021

## 2021-07-31 NOTE — Progress Notes (Signed)
Nutrition Brief Note  Chart reviewed. Pt now transitioning to comfort care.  No further nutrition interventions planned at this time.  Please re-consult as needed.   Aralynn Brake W, RD, LDN, CDCES Registered Dietitian II Certified Diabetes Care and Education Specialist Please refer to [facility] for RD and/or RD on-call/weekend/after hours pager   

## 2021-07-31 NOTE — Plan of Care (Addendum)
Pt slept most of the night. Only drank a few small sips of diet coke. Offered food multiple times. Pt refused. Pt denied pain as well.   Problem: Coping: Goal: Level of anxiety will decrease Outcome: Progressing   Problem: Pain Managment: Goal: General experience of comfort will improve Outcome: Progressing

## 2021-07-31 NOTE — Care Management Important Message (Signed)
Important Message  Patient Details  Name: Marcus Shepherd MRN: [id number] Date of Birth: 02-18-1941   Medicare Important Message Given:  Other (see comment)  On comfort care measures.  Medicare IM withheld at this time out of respect for patient and family.   Dannette Barbara 07/31/2021, 10:46 AM

## 2021-07-31 NOTE — Progress Notes (Signed)
°  Progress Note   Patient: Marcus Shepherd FUX:[id number] DOB: 29-Apr-1941 DOA: 07/25/2021     6 DOS: the patient was seen and examined on 07/31/2021   Brief hospital course: 80 year old white male Primary dysphagia stricture status post dilatations at the [place] in the past--diverticulosis on last colonoscopy 2010, CAD with stent follows with Dr. Saralyn Pilar Progressive dementia on meds, Elevated PSA declines further evaluation   Found facedown complaining of left hip pain on 12/20-was agitated on initial eval. X-ray of pelvis showed left femoral intertrochanteric fracture, 1  12/21 -s/p surgery for left intertrochanteric hip fracture 12/24: Waiting for SNF placement 12/25: Family agreeable for comfort care and hospice 12/26: Hospice home eligible but no beds available today  Assessment and Plan * Hip fracture [facility])- (present on admission) S/p surgery on 12/21 by Dr. Sharlet Salina.  Comfort care.  Palliative care encounter He is hospice home appropriate.  No beds available.  Discussed with hospice liaison.  Likely discharge tomorrow to hospice home if bed available  Protein-calorie malnutrition, severe- (present on admission) Body mass index is 22.11 kg/m.  Overall very poor prognosis.  Comfort care.  Dementia ([place])- (present on admission) Alzheimer's dementia with behavioral disturbances.  Comfort care.  CAD (coronary artery disease), autologous vein bypass graft- (present on admission)  Comfort care.    Subjective: Actively dying  Objective Vital signs were reviewed and unremarkable.  80 year old male lying in the bed confused/obtunded.  Seem to be actively dying Lungs clear to auscultation bilaterally, no wheezing rales rhonchi crepitation Cardiovascular S1-S2 normal, no murmur rales or gallop Abdomen soft, benign Neuro: He is obtunded  Data Reviewed: There are no new results to review at this time.  Family Communication: Updated wife at bedside  Disposition: Status is:  Inpatient  Remains inpatient appropriate because: Waiting for hospice home bed     Time spent: 15 minutes  Author: Max Sane 07/31/2021 1:17 PM  For on call review [email or web address].

## 2021-07-31 NOTE — Progress Notes (Signed)
PT Cancellation Note  Patient Details Name: Marcus Shepherd MRN: [id number] DOB: 1940/10/27   Cancelled Treatment:    Reason Eval/Treat Not Completed: Other (comment).  Pt transitioning to comfort care only at this time.  No PT interventions planned at this time.  Please re-consult as medically necessary.   Gwenlyn Saran, PT, DPT 07/31/21, 10:19 AM

## 2021-07-31 NOTE — Progress Notes (Signed)
VAST RN was approached by V. Manuella Ghazi, MD to obtain IV access for this patient who is switching to comfort care. Upon entering patient's room, I requested to see patient's arms to assess his vasculature. Patient initially refused and stated he was not in pain and did not want an IV placed. After some coaxing, I was able to visualize his arms quickly. His superficial vessels are small and inappropriate for IV access. Patient would not allow me to assess his arms with ultrasound. Notified patient's nurse that he refused IV placement at this time. Advised if circumstances change, she can place an IV team consult and we can try again at that time. She verbalized understanding.

## 2021-07-31 NOTE — Progress Notes (Signed)
[facility] Liaison note:  Referral received for family interest in [facility] home. Patient information reviewed and hospice home eligibility has been confirmed by hospice physician Dr. Jewel Baize. Writer met at beside with patient's wife Santiago Glad to initiate education regarding hospice services, philosophy, team approach to care and current visitation policy. Understanding voiced. Questions answered. Hopsice is unable to offer a bed today, a bed is available tomorrow 12/27 and Marcus Shepherd has accepted this bed. Hospital care team updated and transport has been set up with [facility] for 11:30 am. Mrs. Ohlson made aware.  Liaison team to follow through discharge. Thank you for this referral. Flo Shanks BSN, RN, [place]  [phone]

## 2021-08-01 NOTE — Progress Notes (Signed)
Manufacturing engineer [facility])  Hospice Home is ready for Marcus Shepherd today.  Transport has been arranged to pick him up by 1130, with [facility].   If family is not present in room at time of transport, please call them to let them know of his discharge to [facility].   Venia Carbon BSN, RN [facility] Liaison

## 2021-08-01 NOTE — TOC Progression Note (Signed)
Transition of Care [facility]) - Progression Note    Patient Details  Name: ARBY DAHIR MRN: [id number] Date of Birth: Jul 23, 1941  Transition of Care [facility]) CM/SW [place], RN Phone Number: 08/01/2021, 9:32 AM  Clinical Narrative:   Patient to go to [facility] today, DC packet on shart         Expected Discharge Plan and Services           Expected Discharge Date: 08/01/21                                     Social Determinants of Health (SDOH) Interventions    Readmission Risk Interventions No flowsheet data found.

## 2021-08-08 ENCOUNTER — Encounter: Payer: Self-pay | Admitting: Orthopaedic Surgery

## 2021-09-06 DEATH — deceased

## 2022-03-21 IMAGING — DX DG CHEST 1V PORT
1 series · 1 of 1 positions shown · non-contrast
Comparison: None.

CLINICAL DATA: 80-year-old male with a history of shortness of
breath

EXAM:
PORTABLE CHEST 1 VIEW

[chest ap]
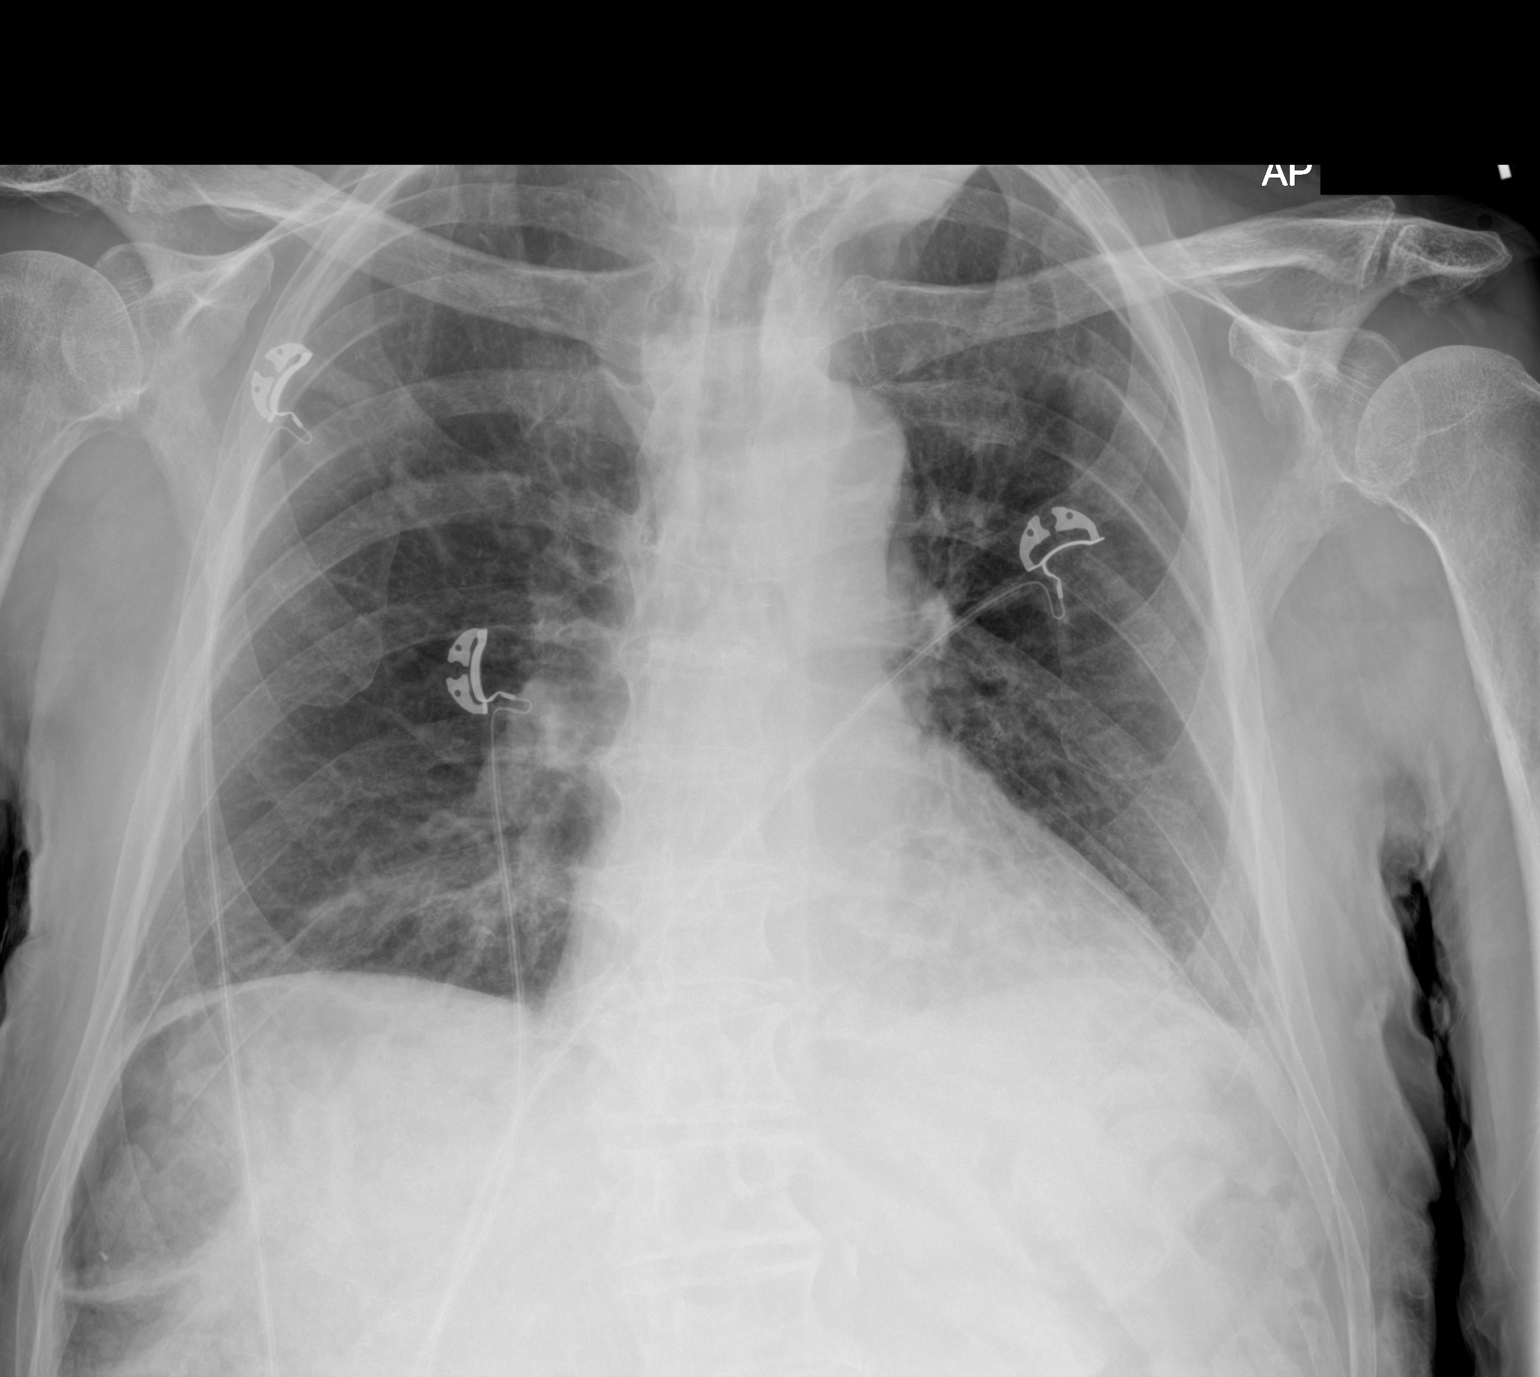

[1 of 1 positions shown; findings below may reference images not displayed]

FINDINGS: Cardiomediastinal silhouette within normal limits in size and
contour. No evidence of central vascular congestion. No interlobular
septal thickening.

Reticulonodular opacities in the lower lungs with no comparison. No
confluent airspace disease.

No pneumothorax or pleural effusion. Coarsened interstitial
markings.

No acute displaced fracture. Degenerative changes of the spine.
IMPRESSION: Mild reticulonodular opacities in the lower lungs, potentially
atypical infection versus chronic changes.

## 2022-06-01 IMAGING — CT CT HIP*L* W/O CM
3 of 7 series · 13 of 46 positions shown, 18 images · non-contrast
Comparison: Radiographs performed earlier on the same date.

CLINICAL DATA: Fall at home, hip fracture

EXAM:
CT OF THE LEFT HIP WITHOUT CONTRAST
TECHNIQUE: Multidetector CT imaging of the left hip was performed according to
the standard protocol. Multiplanar CT image reconstructions were
also generated.

[Series 3: axial st · axial · 0.42mm/px · z∈[-1242,-1010]mm · 11 of 134 slices shown, 16 images]
[im 9/134  soft-tissue]
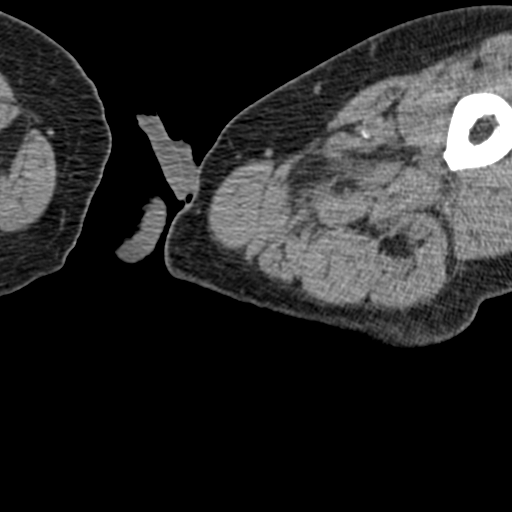
[im 9/134  bone]
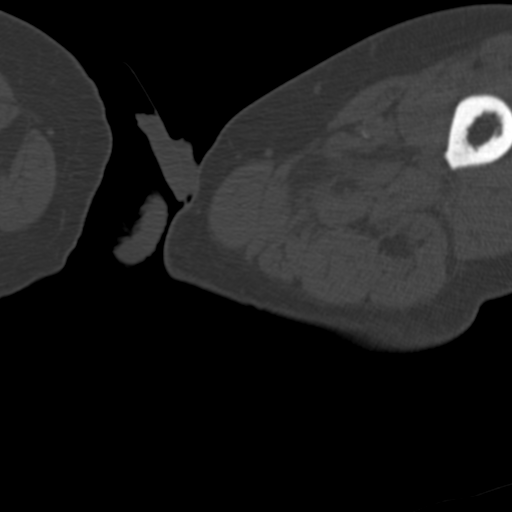
[im 27/134  soft-tissue]
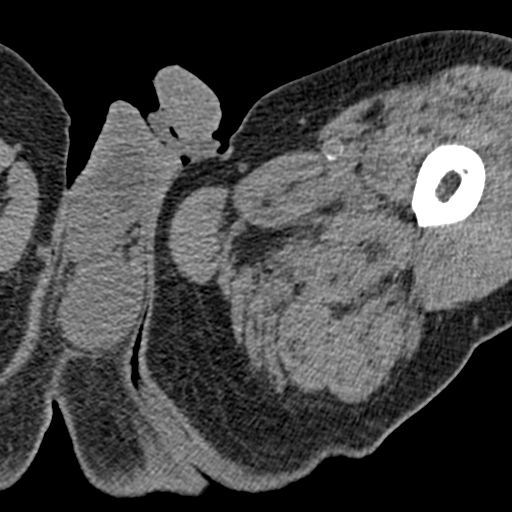
[im 36/134  soft-tissue]
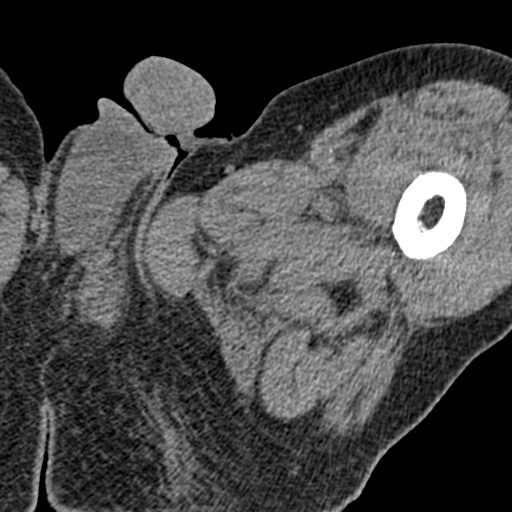
[im 45/134  soft-tissue]
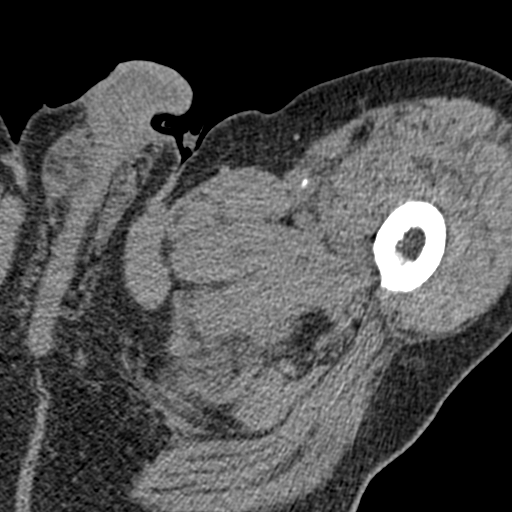
[im 63/134  soft-tissue]
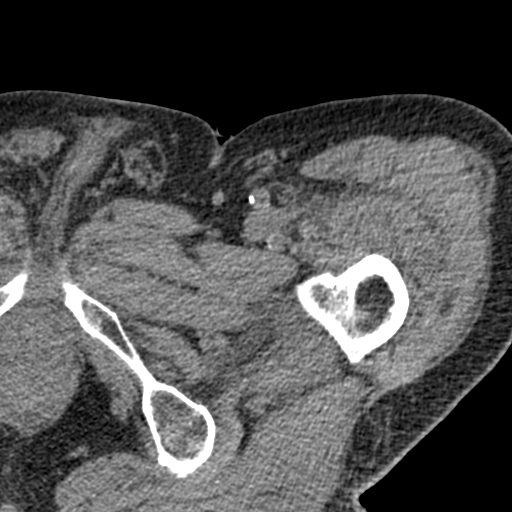
[im 71/134  soft-tissue]
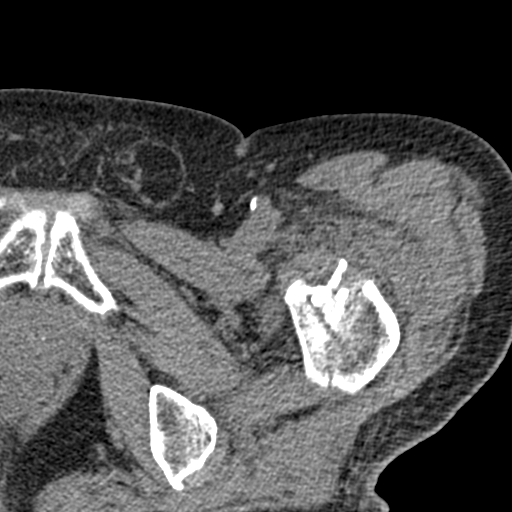
[im 89/134  soft-tissue]
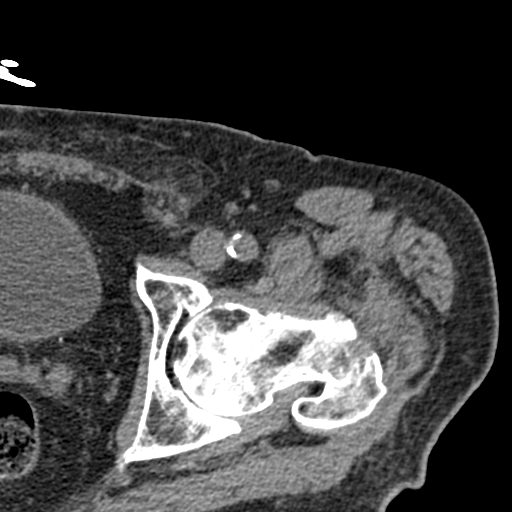
[im 98/134  soft-tissue]
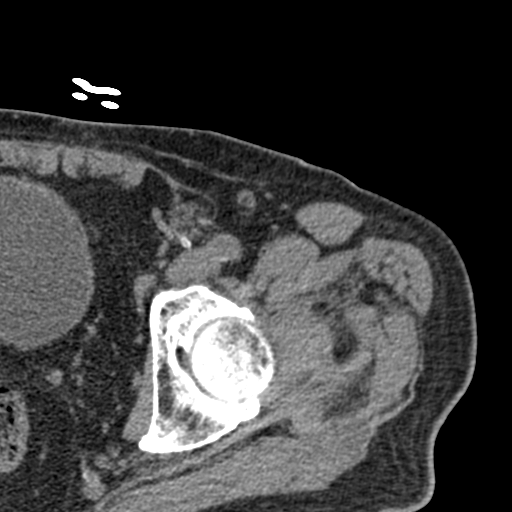
[im 98/134  lung]
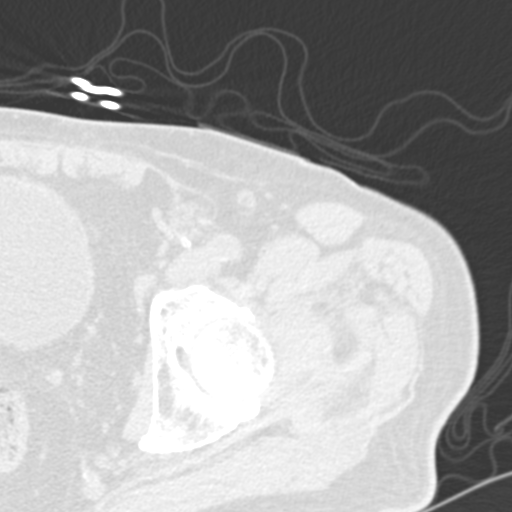
[im 107/134  soft-tissue]
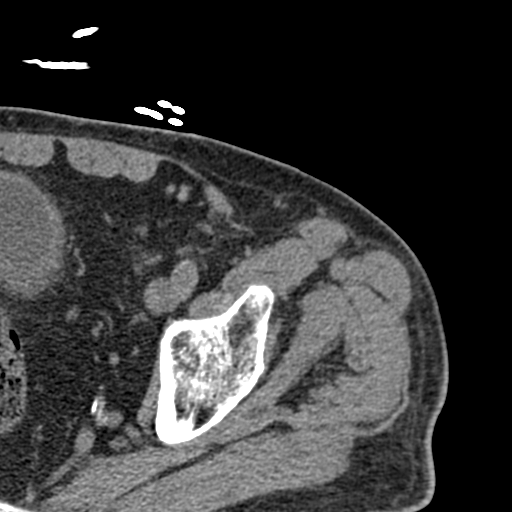
[im 107/134  lung]
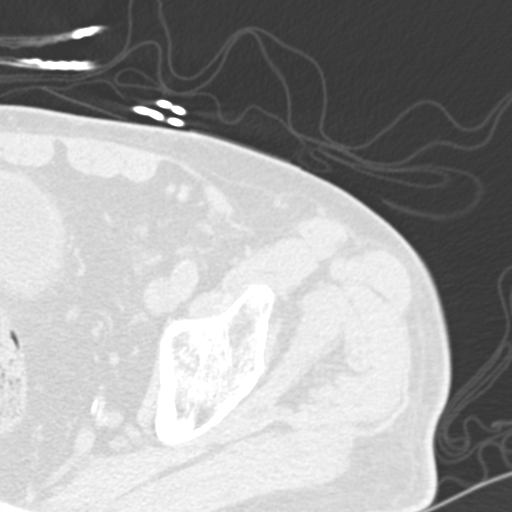
[im 107/134  bone]
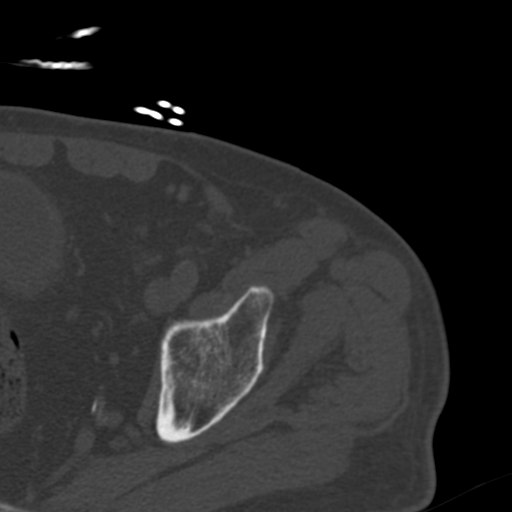
[im 116/134  lung]
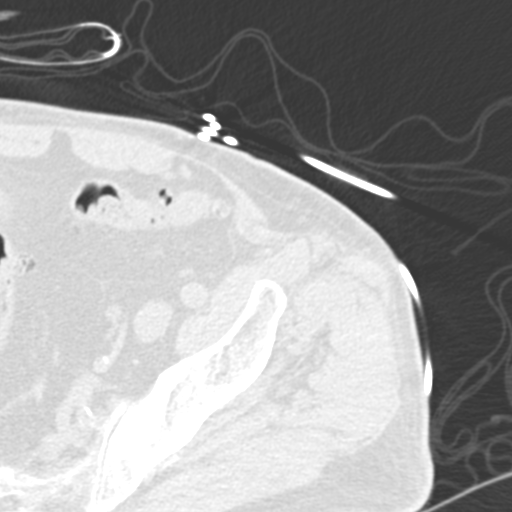
[im 125/134  soft-tissue]
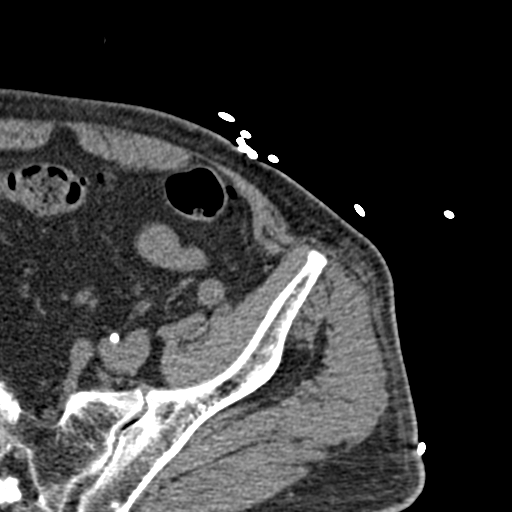
[im 125/134  lung]
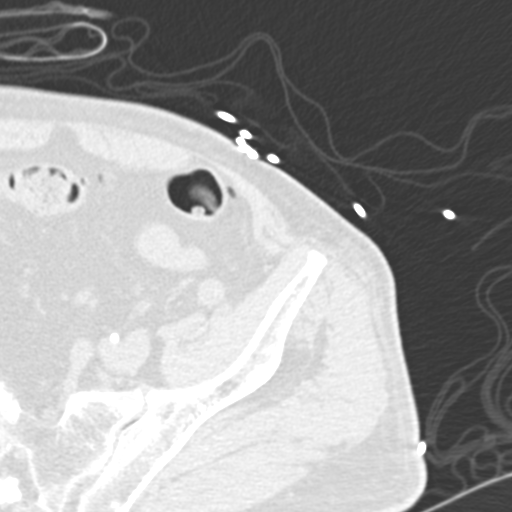

[Series 8: coronal st · coronal · 0.49mm/px · 1 of 130 slices shown]
[im 65/130  soft-tissue]
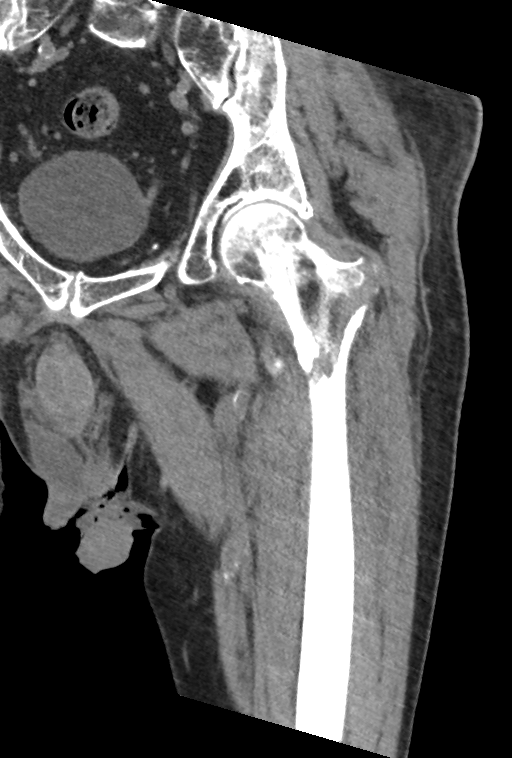

[Series 9: sagittal st · sagittal · 0.45mm/px · 1 of 115 slices shown]
[im 30/115  soft-tissue]
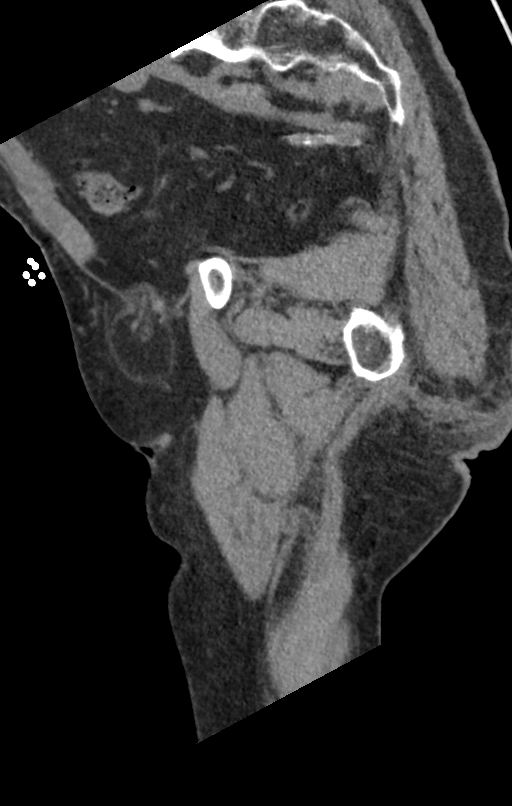

[13 of 46 positions shown; findings below may reference images not displayed]

FINDINGS: Bones/Joint/Cartilage

There is displaced intertrochanteric fracture of the left femur with
superolateral displacement of the distal femur. There is
approximately 11 mm lateral displacement at the lesser trochanter.
The femoral head is located in the acetabulum. No other appreciable
fracture.

Ligaments

Suboptimally assessed by CT.

Muscles and Tendons

Muscles are normal in bulk and density. No intramuscular
hematoma/seroma.

Soft tissues

Atherosclerotic calcification of the femoral vessels. Subcutaneous
fascial stranding about the lateral aspect of the hip. No drainable
fluid collection or hematoma/seroma.
IMPRESSION: Displaced intertrochanteric fracture of the left femur with
superolateral displacement of the distal femur. No other fracture or
dislocation. No significant soft tissue abnormality.
